# Patient Record
Sex: Female | Born: 1980 | Race: White | Hispanic: No | Marital: Married | Smoking: Never smoker
Health system: Southern US, Community
[De-identification: ages and names within clinical notes are randomized; demographics above are authoritative.]

## PROBLEM LIST (undated history)

## (undated) DIAGNOSIS — F419 Anxiety disorder, unspecified: Secondary | ICD-10-CM

## (undated) DIAGNOSIS — R569 Unspecified convulsions: Secondary | ICD-10-CM

## (undated) DIAGNOSIS — R51 Headache: Secondary | ICD-10-CM

## (undated) DIAGNOSIS — F32A Depression, unspecified: Secondary | ICD-10-CM

## (undated) DIAGNOSIS — R519 Headache, unspecified: Secondary | ICD-10-CM

## (undated) DIAGNOSIS — F329 Major depressive disorder, single episode, unspecified: Secondary | ICD-10-CM

## (undated) DIAGNOSIS — J189 Pneumonia, unspecified organism: Secondary | ICD-10-CM

## (undated) DIAGNOSIS — L309 Dermatitis, unspecified: Secondary | ICD-10-CM

## (undated) DIAGNOSIS — J45909 Unspecified asthma, uncomplicated: Secondary | ICD-10-CM

## (undated) HISTORY — DX: Dermatitis, unspecified: L30.9

## (undated) HISTORY — PX: CHOLECYSTECTOMY: SHX55

## (undated) HISTORY — PX: KNEE ARTHROSCOPY: SUR90

## (undated) HISTORY — PX: DILATION AND CURETTAGE OF UTERUS: SHX78

## (undated) HISTORY — DX: Unspecified asthma, uncomplicated: J45.909

## (undated) HISTORY — PX: UPPER GI ENDOSCOPY: SHX6162

## (undated) HISTORY — PX: WISDOM TOOTH EXTRACTION: SHX21

---

## 2003-07-10 DIAGNOSIS — R569 Unspecified convulsions: Secondary | ICD-10-CM

## 2003-07-10 HISTORY — DX: Unspecified convulsions: R56.9

## 2008-08-28 ENCOUNTER — Inpatient Hospital Stay (HOSPITAL_COMMUNITY): Admission: AD | Admit: 2008-08-28 | Discharge: 2008-08-30 | Payer: Self-pay | Admitting: Certified Nurse Midwife

## 2008-08-29 ENCOUNTER — Encounter (INDEPENDENT_AMBULATORY_CARE_PROVIDER_SITE_OTHER): Payer: Self-pay | Admitting: Obstetrics and Gynecology

## 2010-10-24 LAB — CBC
HCT: 30.8 % — ABNORMAL LOW (ref 36.0–46.0)
HCT: 37.9 % (ref 36.0–46.0)
Hemoglobin: 13.1 g/dL (ref 12.0–15.0)
Hemoglobin: 13.5 g/dL (ref 12.0–15.0)
MCHC: 34.2 g/dL (ref 30.0–36.0)
MCV: 96.8 fL (ref 78.0–100.0)
Platelets: 108 10*3/uL — ABNORMAL LOW (ref 150–400)
RBC: 3.19 MIL/uL — ABNORMAL LOW (ref 3.87–5.11)
RBC: 4.11 MIL/uL (ref 3.87–5.11)
RDW: 13 % (ref 11.5–15.5)
WBC: 12.3 10*3/uL — ABNORMAL HIGH (ref 4.0–10.5)
WBC: 16.8 10*3/uL — ABNORMAL HIGH (ref 4.0–10.5)
WBC: 6.6 10*3/uL (ref 4.0–10.5)

## 2015-03-10 DIAGNOSIS — J189 Pneumonia, unspecified organism: Secondary | ICD-10-CM

## 2015-03-10 HISTORY — DX: Pneumonia, unspecified organism: J18.9

## 2015-05-05 ENCOUNTER — Other Ambulatory Visit: Payer: Self-pay | Admitting: Obstetrics and Gynecology

## 2015-05-25 ENCOUNTER — Encounter (HOSPITAL_COMMUNITY): Payer: Self-pay

## 2015-05-30 ENCOUNTER — Encounter (HOSPITAL_COMMUNITY): Payer: Self-pay | Admitting: *Deleted

## 2015-05-30 ENCOUNTER — Ambulatory Visit (HOSPITAL_COMMUNITY)
Admission: RE | Admit: 2015-05-30 | Discharge: 2015-05-30 | Disposition: A | Discharge status: Home / Self Care | Payer: Commercial Managed Care - PPO | Source: Ambulatory Visit | Attending: Obstetrics and Gynecology | Admitting: Obstetrics and Gynecology

## 2015-05-30 ENCOUNTER — Ambulatory Visit (HOSPITAL_COMMUNITY): Payer: Commercial Managed Care - PPO

## 2015-05-30 ENCOUNTER — Encounter (HOSPITAL_COMMUNITY)
Admission: RE | Disposition: A | Discharge status: Home / Self Care | Payer: Self-pay | Source: Ambulatory Visit | Attending: Obstetrics and Gynecology

## 2015-05-30 DIAGNOSIS — Z302 Encounter for sterilization: Secondary | ICD-10-CM | POA: Insufficient documentation

## 2015-05-30 HISTORY — DX: Pneumonia, unspecified organism: J18.9

## 2015-05-30 HISTORY — DX: Major depressive disorder, single episode, unspecified: F32.9

## 2015-05-30 HISTORY — PX: LAPAROSCOPIC TUBAL LIGATION: SHX1937

## 2015-05-30 HISTORY — DX: Anxiety disorder, unspecified: F41.9

## 2015-05-30 HISTORY — DX: Depression, unspecified: F32.A

## 2015-05-30 HISTORY — DX: Unspecified convulsions: R56.9

## 2015-05-30 HISTORY — DX: Headache: R51

## 2015-05-30 HISTORY — DX: Headache, unspecified: R51.9

## 2015-05-30 LAB — CBC
HCT: 42.5 % (ref 36.0–46.0)
HEMOGLOBIN: 14.4 g/dL (ref 12.0–15.0)
MCH: 31.4 pg (ref 26.0–34.0)
MCHC: 33.9 g/dL (ref 30.0–36.0)
MCV: 92.6 fL (ref 78.0–100.0)
PLATELETS: 182 10*3/uL (ref 150–400)
RBC: 4.59 MIL/uL (ref 3.87–5.11)
RDW: 13.1 % (ref 11.5–15.5)
WBC: 8.6 10*3/uL (ref 4.0–10.5)

## 2015-05-30 LAB — PREGNANCY, URINE: PREG TEST UR: NEGATIVE

## 2015-05-30 SURGERY — LIGATION, FALLOPIAN TUBE, LAPAROSCOPIC
Anesthesia: General | Site: Abdomen | Laterality: Bilateral

## 2015-05-30 MED ORDER — LIDOCAINE HCL (CARDIAC) 20 MG/ML IV SOLN
INTRAVENOUS | Status: DC | PRN
  Administered 2015-05-30: 80 mg via INTRAVENOUS

## 2015-05-30 MED ORDER — ONDANSETRON HCL 4 MG/2ML IJ SOLN
INTRAMUSCULAR | Status: AC
  Filled 2015-05-30: qty 2

## 2015-05-30 MED ORDER — BUPIVACAINE HCL (PF) 0.25 % IJ SOLN
INTRAMUSCULAR | Status: AC
  Filled 2015-05-30: qty 30

## 2015-05-30 MED ORDER — DEXAMETHASONE SODIUM PHOSPHATE 4 MG/ML IJ SOLN
INTRAMUSCULAR | Status: AC
  Filled 2015-05-30: qty 1

## 2015-05-30 MED ORDER — ROCURONIUM BROMIDE 100 MG/10ML IV SOLN
INTRAVENOUS | Status: DC | PRN
  Administered 2015-05-30: 30 mg via INTRAVENOUS

## 2015-05-30 MED ORDER — PROPOFOL 10 MG/ML IV BOLUS
INTRAVENOUS | Status: DC | PRN
  Administered 2015-05-30: 200 mg via INTRAVENOUS

## 2015-05-30 MED ORDER — SODIUM CHLORIDE 0.9 % IJ SOLN
INTRAMUSCULAR | Status: AC
  Filled 2015-05-30: qty 10

## 2015-05-30 MED ORDER — FENTANYL CITRATE (PF) 100 MCG/2ML IJ SOLN: 25.0000 ug | INTRAMUSCULAR | Status: DC | PRN

## 2015-05-30 MED ORDER — SCOPOLAMINE 1 MG/3DAYS TD PT72
1.0000 | MEDICATED_PATCH | Freq: Once | TRANSDERMAL | Status: DC
  Administered 2015-05-30: 1.5 mg via TRANSDERMAL

## 2015-05-30 MED ORDER — KETOROLAC TROMETHAMINE 30 MG/ML IJ SOLN
INTRAMUSCULAR | Status: AC
  Filled 2015-05-30: qty 1

## 2015-05-30 MED ORDER — OXYCODONE-ACETAMINOPHEN 5-325 MG PO TABS
ORAL_TABLET | ORAL | Status: AC
  Filled 2015-05-30: qty 1

## 2015-05-30 MED ORDER — MIDAZOLAM HCL 2 MG/2ML IJ SOLN
INTRAMUSCULAR | Status: DC | PRN
  Administered 2015-05-30: 2 mg via INTRAVENOUS

## 2015-05-30 MED ORDER — SCOPOLAMINE 1 MG/3DAYS TD PT72
MEDICATED_PATCH | TRANSDERMAL | Status: AC
  Administered 2015-05-30: 1.5 mg via TRANSDERMAL
  Filled 2015-05-30: qty 1

## 2015-05-30 MED ORDER — PROPOFOL 10 MG/ML IV BOLUS
INTRAVENOUS | Status: AC
  Filled 2015-05-30: qty 20

## 2015-05-30 MED ORDER — OXYCODONE-ACETAMINOPHEN 5-325 MG PO TABS
1.0000 | ORAL_TABLET | Freq: Once | ORAL | Status: AC
  Administered 2015-05-30: 1 via ORAL

## 2015-05-30 MED ORDER — MIDAZOLAM HCL 2 MG/2ML IJ SOLN
INTRAMUSCULAR | Status: AC
  Filled 2015-05-30: qty 2

## 2015-05-30 MED ORDER — MEPERIDINE HCL 25 MG/ML IJ SOLN: 6.2500 mg | INTRAMUSCULAR | Status: DC | PRN

## 2015-05-30 MED ORDER — GLYCOPYRROLATE 0.2 MG/ML IJ SOLN
INTRAMUSCULAR | Status: DC | PRN
  Administered 2015-05-30: 0.2 mg via INTRAVENOUS
  Administered 2015-05-30: 0.4 mg via INTRAVENOUS

## 2015-05-30 MED ORDER — LIDOCAINE HCL (CARDIAC) 20 MG/ML IV SOLN
INTRAVENOUS | Status: AC
  Filled 2015-05-30: qty 5

## 2015-05-30 MED ORDER — ONDANSETRON HCL 4 MG/2ML IJ SOLN
INTRAMUSCULAR | Status: DC | PRN
  Administered 2015-05-30: 4 mg via INTRAVENOUS

## 2015-05-30 MED ORDER — BUPIVACAINE HCL (PF) 0.25 % IJ SOLN
INTRAMUSCULAR | Status: DC | PRN
  Administered 2015-05-30: 2 mL
  Administered 2015-05-30: 6 mL

## 2015-05-30 MED ORDER — KETOROLAC TROMETHAMINE 30 MG/ML IJ SOLN
INTRAMUSCULAR | Status: DC | PRN
  Administered 2015-05-30: 30 mg via INTRAVENOUS
  Administered 2015-05-30: 30 mg via INTRAMUSCULAR

## 2015-05-30 MED ORDER — FENTANYL CITRATE (PF) 100 MCG/2ML IJ SOLN
INTRAMUSCULAR | Status: DC | PRN
  Administered 2015-05-30 (×3): 50 ug via INTRAVENOUS
  Administered 2015-05-30: 100 ug via INTRAVENOUS

## 2015-05-30 MED ORDER — FENTANYL CITRATE (PF) 100 MCG/2ML IJ SOLN
INTRAMUSCULAR | Status: AC
  Filled 2015-05-30: qty 2

## 2015-05-30 MED ORDER — NEOSTIGMINE METHYLSULFATE 10 MG/10ML IV SOLN
INTRAVENOUS | Status: AC
  Filled 2015-05-30: qty 1

## 2015-05-30 MED ORDER — NEOSTIGMINE METHYLSULFATE 10 MG/10ML IV SOLN
INTRAVENOUS | Status: DC | PRN
  Administered 2015-05-30: 3 mg via INTRAVENOUS

## 2015-05-30 MED ORDER — FENTANYL CITRATE (PF) 250 MCG/5ML IJ SOLN
INTRAMUSCULAR | Status: AC
  Filled 2015-05-30: qty 5

## 2015-05-30 MED ORDER — KETOROLAC TROMETHAMINE 30 MG/ML IJ SOLN: 30.0000 mg | Freq: Once | INTRAMUSCULAR | Status: DC | PRN

## 2015-05-30 MED ORDER — ONDANSETRON HCL 4 MG/2ML IJ SOLN
4.0000 mg | Freq: Once | INTRAMUSCULAR | Status: AC | PRN
  Administered 2015-05-30: 4 mg via INTRAVENOUS

## 2015-05-30 MED ORDER — GLYCOPYRROLATE 0.2 MG/ML IJ SOLN
INTRAMUSCULAR | Status: AC
  Filled 2015-05-30: qty 1

## 2015-05-30 MED ORDER — DEXAMETHASONE SODIUM PHOSPHATE 10 MG/ML IJ SOLN
INTRAMUSCULAR | Status: DC | PRN
  Administered 2015-05-30: 4 mg via INTRAVENOUS

## 2015-05-30 MED ORDER — IBUPROFEN 800 MG PO TABS: 800.0000 mg | ORAL_TABLET | Freq: Three times a day (TID) | ORAL | Status: DC | PRN

## 2015-05-30 MED ORDER — GLYCOPYRROLATE 0.2 MG/ML IJ SOLN
INTRAMUSCULAR | Status: AC
  Filled 2015-05-30: qty 2

## 2015-05-30 MED ORDER — SODIUM CHLORIDE 0.9 % IJ SOLN
INTRAMUSCULAR | Status: DC | PRN
  Administered 2015-05-30: 10 mL

## 2015-05-30 MED ORDER — LACTATED RINGERS IV SOLN
INTRAVENOUS | Status: DC
  Administered 2015-05-30 (×2): via INTRAVENOUS

## 2015-05-30 SURGICAL SUPPLY — 21 items
CATH ROBINSON RED A/P 16FR (CATHETERS) ×3 IMPLANT
CLOTH BEACON ORANGE TIMEOUT ST (SAFETY) ×3 IMPLANT
DRSG COVADERM PLUS 2X2 (GAUZE/BANDAGES/DRESSINGS) IMPLANT
DRSG OPSITE POSTOP 3X4 (GAUZE/BANDAGES/DRESSINGS) ×3 IMPLANT
DURAPREP 26ML APPLICATOR (WOUND CARE) ×3 IMPLANT
ELECT REM PT RETURN 9FT ADLT (ELECTROSURGICAL) ×3
ELECTRODE REM PT RTRN 9FT ADLT (ELECTROSURGICAL) ×1 IMPLANT
GLOVE BIOGEL PI IND STRL 7.0 (GLOVE) ×2 IMPLANT
GLOVE BIOGEL PI INDICATOR 7.0 (GLOVE) ×4
GLOVE ECLIPSE 6.5 STRL STRAW (GLOVE) ×3 IMPLANT
GOWN STRL REUS W/TWL LRG LVL3 (GOWN DISPOSABLE) ×6 IMPLANT
NEEDLE INSUFFLATION 120MM (ENDOMECHANICALS) ×3 IMPLANT
PACK LAPAROSCOPY BASIN (CUSTOM PROCEDURE TRAY) ×3 IMPLANT
PAD POSITIONING PINK XL (MISCELLANEOUS) ×3 IMPLANT
PENCIL BUTTON HOLSTER BLD 10FT (ELECTRODE) ×3 IMPLANT
SUT VICRYL 0 UR6 27IN ABS (SUTURE) ×3 IMPLANT
SUT VICRYL 4-0 PS2 18IN ABS (SUTURE) ×3 IMPLANT
TOWEL OR 17X24 6PK STRL BLUE (TOWEL DISPOSABLE) ×6 IMPLANT
TROCAR OPTI TIP 5M 100M (ENDOMECHANICALS) ×3 IMPLANT
TROCAR XCEL DIL TIP R 11M (ENDOMECHANICALS) ×3 IMPLANT
WATER STERILE IRR 1000ML POUR (IV SOLUTION) ×3 IMPLANT

## 2015-05-30 NOTE — Anesthesia Postprocedure Evaluation (Signed)
Anesthesia Post Note  Patient: Amy [name]  Procedure(s) Performed: Procedure(s) (LRB): LAPAROSCOPIC TUBAL LIGATION With Bipolar Cautery (Bilateral)  Patient location during evaluation: PACU Anesthesia Type: General Level of consciousness: awake Pain management: pain level controlled Vital Signs Assessment: post-procedure vital signs reviewed and stable Respiratory status: spontaneous breathing Cardiovascular status: blood pressure returned to baseline Postop Assessment: No signs of nausea or vomiting Anesthetic complications: no    Last Vitals:  Filed Vitals:   05/30/15 1315 05/30/15 1400  BP: 117/62 125/81  Pulse: 121 106  Temp:  37.1 C  Resp: 18 16    Last Pain:  Filed Vitals:   05/30/15 1410  PainSc: 5                  Attikus Bartoszek JR,JOHN Lauryl Seyer

## 2015-05-30 NOTE — Brief Op Note (Signed)
05/30/2015  11:41 AM  PATIENT:  Amy [name]  34 y.o. female  PRE-OPERATIVE DIAGNOSIS:  Desires Sterilization  POST-OPERATIVE DIAGNOSIS:  Desires Sterilization  PROCEDURE:  Procedure(s): LAPAROSCOPIC TUBAL LIGATION With Bipolar Cautery (Bilateral)  SURGEON:  Surgeon(s) and Role:    * Maxie BetterSheronette Rykin Route, MD - Primary  PHYSICIAN ASSISTANT:   ASSISTANTS: none   ANESTHESIA:   general  EBL:  Total I/O In: 1000 [I.V.:1000] Out: 40 [Urine:30; Blood:10]  BLOOD ADMINISTERED:none  DRAINS: none   LOCAL MEDICATIONS USED:  MARCAINE     SPECIMEN:  No Specimen  DISPOSITION OF SPECIMEN:  N/A  COUNTS:  YES  TOURNIQUET:  * No tourniquets in log *  DICTATION: .Other Dictation: Dictation Number [id number]  PLAN OF CARE: Discharge to home after PACU  PATIENT DISPOSITION:  PACU - hemodynamically stable.   Delay start of Pharmacological VTE agent (>24hrs) due to surgical blood loss or risk of bleeding: no

## 2015-05-30 NOTE — H&P (Signed)
Amy ClicheVonda W [name] is an 34 y.o. female. G1P1 MF presents for permanent sterilization via LTL. Pt is on OCP. Hx migraine. Declines alternative birth control( LARC)  Pertinent Gynecological History: Menses: flow is light Bleeding: reg Contraception: OCP (estrogen/progesterone) DES exposure: denies Blood transfusions: none Sexually transmitted diseases: no past history Previous GYN Procedures: none  Last mammogram: n/a Date: n/a Last pap: normal Date:7.23.14 OB History: G1, P1   Menstrual History: Menarche age: n/a No LMP recorded. Patient is not currently having periods (Reason: Oral contraceptives).    Past Medical History  Diagnosis Date  . Pneumonia 03/2015  . Depression   . Anxiety   . Seizures (HCC) 2005    no medications currently  . Headache     Migraines    Past Surgical History  Procedure Laterality Date  . Cholecystectomy    . Knee arthroscopy    . Wisdom tooth extraction    . Upper gi endoscopy    . Dilation and curettage of uterus      History reviewed. No pertinent family history.  Social History:  reports that she has never smoked. She has never used smokeless tobacco. She reports that she drinks alcohol. She reports that she does not use illicit drugs.  Allergies:  Allergies  Allergen Reactions  . Codeine Nausea And Vomiting    Patients states she is able to take it with antinausea medications  . Tramadol Other (See Comments)    Headaches  . Clindamycin/Lincomycin Rash   Medication: Lexapro 10mg  po qd Wellbutrin XL 300mg  q d Alesse one po qd Maxalt 10 mg Ambien 5 mg po qhs prn  Review of Systems  All other systems reviewed and are negative.   There were no vitals taken for this visit. Physical Exam  Constitutional: She is oriented to person, place, and time. She appears well-developed and well-nourished.  Eyes: EOM are normal.  Cardiovascular: Normal rate and regular rhythm.   Respiratory: Breath sounds normal.  GI: Soft.  Genitourinary:  Uterus normal.  Neurological: She is oriented to person, place, and time.  Skin: Skin is warm and dry.  Psychiatric: She has a normal mood and affect.  vulva nl  Vagina nl Cervix nl Uterus nl anterior Adnexa nl  No results found for this or any previous visit (from the past 24 hour(s)).  No results found.  Assessment/Plan: Desires sterilization P) LTL with cautery. Risk of surgery includes infection, bleeding, injury to Underlying organ structures, permanent sterilization, failure rate 1/500-1/600, nonreversible. All ? answered  Rodolfo Notaro A 05/30/2015, 9:43 AM

## 2015-05-30 NOTE — Anesthesia Postprocedure Evaluation (Signed)
Anesthesia Post Note  Patient: Amy ClicheVonda W [name]  Procedure(s) Performed: Procedure(s) (LRB): LAPAROSCOPIC TUBAL LIGATION With Bipolar Cautery (Bilateral)  Patient location during evaluation: PACU Anesthesia Type: General Level of consciousness: awake and alert Pain management: pain level controlled Vital Signs Assessment: post-procedure vital signs reviewed and stable Respiratory status: spontaneous breathing, nonlabored ventilation, respiratory function stable and patient connected to nasal cannula oxygen Cardiovascular status: blood pressure returned to baseline and stable Postop Assessment: No signs of nausea or vomiting Anesthetic complications: no    Last Vitals:  Filed Vitals:   05/30/15 0958 05/30/15 1149  BP: 132/87 127/76  Pulse: 89 111  Temp: 36.8 C 37.1 C  Resp: 18 18    Last Pain: There were no vitals filed for this visit.               [name]

## 2015-05-30 NOTE — Transfer of Care (Signed)
Immediate Anesthesia Transfer of Care Note  Patient: Amy [name]  Procedure(s) Performed: Procedure(s): LAPAROSCOPIC TUBAL LIGATION With Bipolar Cautery (Bilateral)  Patient Location: PACU  Anesthesia Type:General  Level of Consciousness: awake, alert  and oriented  Airway & Oxygen Therapy: Patient Spontanous Breathing and Patient connected to nasal cannula oxygen  Post-op Assessment: Report given to RN and Post -op Vital signs reviewed and stable  Post vital signs: Reviewed and stable  Last Vitals:  Filed Vitals:   05/30/15 0958  BP: 132/87  Pulse: 89  Temp: 36.8 C  Resp: 18    Complications: No apparent anesthesia complications

## 2015-05-30 NOTE — Discharge Instructions (Signed)
Warm heat to abdomen every 4 hrs x 24 hrs. Ambulate. Finish pill packDISCHARGE INSTRUCTIONS: Laparoscopy  The following instructions have been prepared to help you care for yourself upon your return home today.  Wound care:  Do not get the incision wet for the first 24 hours. The incision should be kept clean and dry.  The Band-Aids or dressings may be removed the day after surgery.  Should the incision become sore, red, and swollen after the first week, check with your doctor.  Personal hygiene:  Shower the day after your procedure.  Activity and limitations:  Do NOT drive or operate any equipment today.  Do NOT lift anything more than 15 pounds for 2-3 weeks after surgery.  Do NOT rest in bed all day.  Walking is encouraged. Walk each day, starting slowly with 5-minute walks 3 or 4 times a day. Slowly increase the length of your walks.  Walk up and down stairs slowly.  Do NOT do strenuous activities, such as golfing, playing tennis, bowling, running, biking, weight lifting, gardening, mowing, or vacuuming for 2-4 weeks. Ask your doctor when it is okay to start.  Diet: Eat a light meal as desired this evening. You may resume your usual diet tomorrow.  Return to work: This is dependent on the type of work you do. For the most part you can return to a desk job within a week of surgery. If you are more active at work, please discuss this with your doctor.  What to expect after your surgery: You may have a slight burning sensation when you urinate on the first day. You may have a very small amount of blood in the urine. Expect to have a small amount of vaginal discharge/light bleeding for 1-2 weeks. It is not unusual to have abdominal soreness and bruising for up to 2 weeks. You may be tired and need more rest for about 1 week. You may experience shoulder pain for 24-72 hours. Lying flat in bed may relieve it.  Call your doctor for any of the following:  Develop a fever of 100.4 or  greater  Inability to urinate 6 hours after discharge from hospital  Severe pain not relieved by pain medications  Persistent of heavy bleeding at incision site  Redness or swelling around incision site after a week  Increasing nausea or vomiting  Patient Signature________________________________________ Nurse Signature_________________________________________ Post Anesthesia Home Care Instructions  Activity: Get plenty of rest for the remainder of the day. A responsible adult should stay with you for 24 hours following the procedure.  For the next 24 hours, DO NOT: -Drive a car -Advertising copywriterperate machinery -Drink alcoholic beverages -Take any medication unless instructed by your physician -Make any legal decisions or sign important papers.  Meals: Start with liquid foods such as gelatin or soup. Progress to regular foods as tolerated. Avoid greasy, spicy, heavy foods. If nausea and/or vomiting occur, drink only clear liquids until the nausea and/or vomiting subsides. Call your physician if vomiting continues.  Special Instructions/Symptoms: Your throat may feel dry or sore from the anesthesia or the breathing tube placed in your throat during surgery. If this causes discomfort, gargle with warm salt water. The discomfort should disappear within 24 hours.  If you had a scopolamine patch placed behind your ear for the management of post- operative nausea and/or vomiting:  1. The medication in the patch is effective for 72 hours, after which it should be removed.  Wrap patch in a tissue and discard in the trash.  Wash hands thoroughly with soap and water. °2. You may remove the patch earlier than 72 hours if you experience unpleasant side effects which may include dry mouth, dizziness or visual disturbances. °3. Avoid touching the patch. Wash your hands with soap and water after contact with the patch. °  ° °

## 2015-05-30 NOTE — Anesthesia Preprocedure Evaluation (Signed)
Anesthesia Evaluation  Patient identified by MRN, date of birth, ID band Patient awake    Reviewed: Allergy & Precautions, H&P , NPO status , Patient's Chart, lab work & pertinent test results  Airway Mallampati: I  TM Distance: >3 FB Neck ROM: full    Dental no notable dental hx.    Pulmonary    Pulmonary exam normal        Cardiovascular negative cardio ROS Normal cardiovascular exam     Neuro/Psych    GI/Hepatic negative GI ROS, Neg liver ROS,   Endo/Other  negative endocrine ROS  Renal/GU negative Renal ROS     Musculoskeletal   Abdominal Normal abdominal exam  (+)   Peds  Hematology negative hematology ROS (+)   Anesthesia Other Findings   Reproductive/Obstetrics (+) Pregnancy                             Anesthesia Physical Anesthesia Plan  ASA: II  Anesthesia Plan: General   Post-op Pain Management:    Induction: Intravenous  Airway Management Planned: Oral ETT  Additional Equipment:   Intra-op Plan:   Post-operative Plan: Extubation in OR  Informed Consent: I have reviewed the patients History and Physical, chart, labs and discussed the procedure including the risks, benefits and alternatives for the proposed anesthesia with the patient or authorized representative who has indicated his/her understanding and acceptance.     Plan Discussed with: CRNA and Surgeon  Anesthesia Plan Comments:         Anesthesia Quick Evaluation

## 2015-05-30 NOTE — Anesthesia Procedure Notes (Signed)
Procedure Name: Intubation Date/Time: 05/30/2015 11:02 AM Performed by: COUSINS, SHERONETTE Pre-anesthesia Checklist: Patient identified, Emergency Drugs available, Suction available and Patient being monitored Patient Re-evaluated:Patient Re-evaluated prior to inductionOxygen Delivery Method: Circle system utilized Preoxygenation: Pre-oxygenation with 100% oxygen Intubation Type: IV induction Ventilation: Mask ventilation without difficulty Laryngoscope Size: Miller and 2 Grade View: Grade II Tube type: Oral Tube size: 7.0 mm Number of attempts: 2 Airway Equipment and Method: Stylet Placement Confirmation: ETT inserted through vocal cords under direct vision,  breath sounds checked- equal and bilateral and positive ETCO2 Secured at: 21 cm Tube secured with: Tape Dental Injury: Teeth and Oropharynx as per pre-operative assessment

## 2015-05-31 ENCOUNTER — Encounter (HOSPITAL_COMMUNITY): Payer: Self-pay | Admitting: Obstetrics and Gynecology

## 2015-05-31 NOTE — Op Note (Signed)
[name]:  [name], Amy              ACCOUNT NO.:  [id number]  MEDICAL RECORD NO.:  [id number]  LOCATION:  [facility]                          FACILITY:  [facility]  PHYSICIAN:  Maxie BetterSheronette Lydie Stammen, M.D.DATE OF BIRTH:  1981-04-10  DATE OF PROCEDURE:  05/30/2015 DATE OF DISCHARGE:  05/30/2015                              OPERATIVE REPORT   PREOPERATIVE DIAGNOSIS:  Desires sterilization.  PROCEDURE:  Laparoscopic tubal ligation with bipolar cautery.  POSTOPERATIVE DIAGNOSIS:  Desires sterilization.  ANESTHESIA:  General.  SURGEON:  Maxie BetterSheronette Argenis Kumari, M.D.  ASSISTANT:  None.  DESCRIPTION OF PROCEDURE:  Under adequate general anesthesia, the patient was placed in dorsal lithotomy position.  She was sterilely prepped in the  usual fashion.  Bladder was catheterized for moderate amount of urine.  Examination under anesthesia revealed an axial uterus.  No adnexal masses could be appreciated.  Bivalve speculum placed in the vagina.  Single-tooth tenaculum was placed on the anterior lip of the cervix.  An Acorn cannula was introduced into the cervical os and attached to the tenaculum for manipulation of the uterus.  The bivalve speculum was then removed.  The patient was sterilely draped.  A 0.25% Marcaine was injected.  An infraumbilical incision was then made. Veress needle was introduced, tested, and 3 L of CO2 was insufflated. Veress needle was then removed.  A 10 mm disposable trocar with sleeve was introduced into the abdomen without incident.  A lighted video laparoscope was then placed through that port.  Entry into the abdomen without incident.  Panoramic inspection revealed a normal liver edge. Small amount of adhesion on the right anterior abdominal wall and the pelvis was notable for both ovaries to be normal.  No evidence of endometriosis.  Normal fallopian tubes bilaterally.  A suprapubic incision was made after 0.25% Marcaine was injected and a 5-mm port was placed under direct  visualization.  Further inspection was continued through the lower port.  Bipolar cautery was then placed.  The midportion of both fallopian tubes was then cauterized.  Once this was felt to be adequate, the suprapubic site was removed under direct visualization.  The abdomen was deflated as the infraumbilical site was then removed.  0-Vicryl figure-of-eight sutures placed through the fascia in the infraumbilical site and the skin incisions were then closed with 4-0 Vicryl subcuticular sutures.  The instruments in the vagina were removed.  SPECIMEN:  None.  ESTIMATED BLOOD LOSS:  Minimal.  COMPLICATIONS:  None.  The patient tolerated the procedure well, was transferred to the recovery room in stable condition.     Maxie BetterSheronette Eura Radabaugh, M.D.     [place]/MEDQ  D:  05/31/2015  T:  05/31/2015  Job:  [id number]

## 2021-07-09 HISTORY — PX: OTHER SURGICAL HISTORY: SHX169

## 2021-08-09 ENCOUNTER — Other Ambulatory Visit: Payer: Self-pay

## 2021-08-09 ENCOUNTER — Emergency Department (HOSPITAL_BASED_OUTPATIENT_CLINIC_OR_DEPARTMENT_OTHER): Payer: Commercial Managed Care - PPO

## 2021-08-09 ENCOUNTER — Emergency Department (HOSPITAL_BASED_OUTPATIENT_CLINIC_OR_DEPARTMENT_OTHER)
Admission: EM | Admit: 2021-08-09 | Discharge: 2021-08-09 | Disposition: A | Discharge status: Home / Self Care | Payer: Commercial Managed Care - PPO | Attending: Emergency Medicine | Admitting: Emergency Medicine

## 2021-08-09 ENCOUNTER — Encounter (HOSPITAL_BASED_OUTPATIENT_CLINIC_OR_DEPARTMENT_OTHER): Payer: Self-pay | Admitting: Emergency Medicine

## 2021-08-09 DIAGNOSIS — M545 Low back pain, unspecified: Secondary | ICD-10-CM | POA: Insufficient documentation

## 2021-08-09 DIAGNOSIS — R310 Gross hematuria: Secondary | ICD-10-CM | POA: Insufficient documentation

## 2021-08-09 DIAGNOSIS — R1030 Lower abdominal pain, unspecified: Secondary | ICD-10-CM | POA: Insufficient documentation

## 2021-08-09 DIAGNOSIS — R319 Hematuria, unspecified: Secondary | ICD-10-CM | POA: Diagnosis present

## 2021-08-09 LAB — CBC WITH DIFFERENTIAL/PLATELET
Abs Immature Granulocytes: 0.01 10*3/uL (ref 0.00–0.07)
Basophils Absolute: 0.1 10*3/uL (ref 0.0–0.1)
Basophils Relative: 1 %
Eosinophils Absolute: 0.1 10*3/uL (ref 0.0–0.5)
Eosinophils Relative: 2 %
HCT: 40.6 % (ref 36.0–46.0)
Hemoglobin: 14.4 g/dL (ref 12.0–15.0)
Immature Granulocytes: 0 %
Lymphocytes Relative: 23 %
Lymphs Abs: 1.3 10*3/uL (ref 0.7–4.0)
MCH: 30.9 pg (ref 26.0–34.0)
MCHC: 35.5 g/dL (ref 30.0–36.0)
MCV: 87.1 fL (ref 80.0–100.0)
Monocytes Absolute: 0.4 10*3/uL (ref 0.1–1.0)
Monocytes Relative: 8 %
Neutro Abs: 3.5 10*3/uL (ref 1.7–7.7)
Neutrophils Relative %: 66 %
Platelets: 178 10*3/uL (ref 150–400)
RBC: 4.66 MIL/uL (ref 3.87–5.11)
RDW: 12.2 % (ref 11.5–15.5)
WBC: 5.4 10*3/uL (ref 4.0–10.5)
nRBC: 0 % (ref 0.0–0.2)

## 2021-08-09 LAB — PROTIME-INR
INR: 1 (ref 0.8–1.2)
Prothrombin Time: 12.8 seconds (ref 11.4–15.2)

## 2021-08-09 LAB — URINALYSIS, MICROSCOPIC (REFLEX): RBC / HPF: >50 RBC/hpf (ref 0–5)

## 2021-08-09 LAB — URINALYSIS, ROUTINE W REFLEX MICROSCOPIC
Bilirubin Urine: NEGATIVE
Glucose, UA: NEGATIVE mg/dL
Ketones, ur: NEGATIVE mg/dL
Leukocytes,Ua: NEGATIVE
Nitrite: NEGATIVE
Protein, ur: NEGATIVE mg/dL
Specific Gravity, Urine: 1.02 (ref 1.005–1.030)
pH: 6 (ref 5.0–8.0)

## 2021-08-09 LAB — COMPREHENSIVE METABOLIC PANEL
ALT: 25 U/L (ref 0–44)
AST: 35 U/L (ref 15–41)
Albumin: 4.5 g/dL (ref 3.5–5.0)
Alkaline Phosphatase: 50 U/L (ref 38–126)
Anion gap: 9 (ref 5–15)
BUN: 14 mg/dL (ref 6–20)
CO2: 25 mmol/L (ref 22–32)
Calcium: 8.8 mg/dL — ABNORMAL LOW (ref 8.9–10.3)
Chloride: 104 mmol/L (ref 98–111)
Creatinine, Ser: 0.86 mg/dL (ref 0.44–1.00)
GFR, Estimated: >60 mL/min (ref >60)
Glucose, Bld: 109 mg/dL — ABNORMAL HIGH (ref 70–99)
Potassium: 4.2 mmol/L (ref 3.5–5.1)
Sodium: 138 mmol/L (ref 135–145)
Total Bilirubin: 1 mg/dL (ref 0.3–1.2)
Total Protein: 7.3 g/dL (ref 6.5–8.1)

## 2021-08-09 LAB — PREGNANCY, URINE: Preg Test, Ur: NEGATIVE

## 2021-08-09 MED ORDER — IOHEXOL 300 MG/ML  SOLN
100.0000 mL | Freq: Once | INTRAMUSCULAR | Status: AC | PRN
  Administered 2021-08-09: 100 mL via INTRAVENOUS

## 2021-08-09 NOTE — ED Notes (Signed)
Pt ambulatory with steady gait to restroom to provide urine specimen 
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
## 2022-11-20 NOTE — Progress Notes (Signed)
NEW PATIENT Date of Service/Encounter:  11/22/22 Referring provider: Raynelle Jan., MD Primary care provider: Raynelle Jan., MD  Subjective:  Amy [name] is a 42 y.o. female with a PMHx of hyperlipidemia, insomnia presenting today for evaluation of asthma and allergies. History obtained from: chart review and patient.   Asthma History:  -Diagnosed at age 44 yo.  -Current symptoms include  throat tightness and chest tightness, SOB, trouble getting air in Using rescue inhaler once or twice per month -Limitations to daily activity:  moderate - 0 ED visits, 0 UC visits and 1 oral steroids in the past year - 0 number of lifetime hospitalizations, 0 number of lifetime intubations.  - Identified Triggers:  tree pollen and exercise - Up-to-date with Covid-19, and Flu, vaccines. - History of prior pneumonias: yes-around 4 years ago, had PNA twice then then next year once and this was when she was diagnosed with asthma and started advair, no pneumonias since-treated as outpatient - History of prior COVID-19 infection: 2023, 2022-did okay respiratory - Smoking exposure: no Previous Diagnostics:  - Prior PFTs or spirometry: none - Most Recent AEC (02/03/22): 100 -Most Recent Chest Imaging: CXR on (05/06/20): read as normal, images not available for review Management:  - Current regimen:  - Maintenance: advair 500-50 mcg/dose 1 puff BID, singulair - Rescue: Albuterol 2 puffs q4-6 hrs PRN, none prior to exercise  Chronic rhinitis: started in adulthood Symptoms include:  headache, cough, nasal congestion, and rhinorrhea, dry eyes but otherwise no ocular symptoms Irritants (perfumes, strong smells) give her immediate headache Occurs seasonally-spring and fall Potential triggers: tree pollen Treatments tried: zyrtec and flonsae Previous allergy testing:  around 5 or 42 yo, allergic to dust History of reflux/heartburn: no Previous sinus, ear, tonsil, adenoid surgeries: no  Eczema:   Occasionally around her mouth Will use OTC cetaphil  And if stays moisturized   Other allergy screening: Food allergy: no Medication allergy:  clindamycin causes a rash, codeine makes me nauseous Hymenoptera allergy: no Urticaria: no History of recurrent infections suggestive of immunodeficency: no Vaccinations are up to date.   Past Medical History: Past Medical History:  Diagnosis Date   Anxiety    Asthma    Depression    Eczema    Headache    Migraines   Pneumonia 03/10/2015   Seizures (HCC) 07/10/2003   no medications currently   Vaginal delivery 07/09/2008   Medication List:  Current Outpatient Medications  Medication Sig Dispense Refill   ALPRAZolam (XANAX) 0.5 MG tablet TAKE 1 TABLET BY MOUTH EVERY DAY FOR ANXIETY     bupivacaine (MARCAINE) 0.25 % injection Inject into the skin.     Crisaborole (EUCRISA) 2 % OINT Apply 1 Application topically 2 (two) times daily as needed. 100 g 3   cyclobenzaprine (FLEXERIL) 5 MG tablet Take 5 mg by mouth at bedtime as needed.     escitalopram (LEXAPRO) 10 MG tablet Take 10 mg by mouth daily.     fluticasone (FLONASE) 50 MCG/ACT nasal spray 1 spray Once Daily.     hydrocortisone (ANUSOL-HC) 25 MG suppository INSERT ONE SUPPOSITORY RECTALLY TWICE A DAY FOR 6 DAYS     hydrocortisone 2.5 % ointment Apply topically twice daily as need to red sandpapery rash. 30 g 3   Lactobacillus Rhamnosus, GG, (CULTURELLE) CAPS Take 1 capsule by mouth daily.     levocetirizine (XYZAL) 5 MG tablet Take 1 tablet (5 mg total) by mouth every evening. 32 tablet 5   methocarbamol (ROBAXIN)  500 MG tablet TAKE ONE TABLET BY MOUTH FOUR TIMES A DAY **DO NOT TAKE WITHIN 6 HOURS OF CYCLOBENZAPRINE**     Naltrexone-buPROPion HCl ER (CONTRAVE) 8-90 MG TB12 Take 2 tablets by mouth 2 (two) times daily.     Olopatadine HCl 0.2 % SOLN Apply 2 drops to eye 2 (two) times daily as needed. 2.5 mL 5   Olopatadine-Mometasone (RYALTRIS) 665-25 MCG/ACT SUSP Place 1-2 sprays  into the nose 2 (two) times daily. 29 g 5   ondansetron (ZOFRAN-ODT) 4 MG disintegrating tablet Take by mouth.     rizatriptan (MAXALT) 10 MG tablet Take 10 mg by mouth as needed for migraine. May repeat in 2 hours if needed     traZODone (DESYREL) 50 MG tablet Take 50 mg by mouth at bedtime.     triamcinolone acetonide (KENALOG-40) 40 MG/ML injection Inject into the articular space.     zolpidem (AMBIEN) 5 MG tablet Take 10 mg by mouth at bedtime as needed for sleep.     albuterol (VENTOLIN HFA) 108 (90 Base) MCG/ACT inhaler Inhale 2 puffs into the lungs every 4 (four) hours as needed for wheezing or shortness of breath. 8 g 2   fluticasone-salmeterol (ADVAIR) 500-50 MCG/ACT AEPB Inhale 1 puff into the lungs in the morning and at bedtime. 1 each 5   montelukast (SINGULAIR) 10 MG tablet Take 1 tablet (10 mg total) by mouth daily. 32 tablet 5   No current facility-administered medications for this visit.   Known Allergies:  Allergies  Allergen Reactions   Codeine Nausea And Vomiting    Patients states she is able to take it with antinausea medications   Tramadol Other (See Comments)    Headaches   Clindamycin/Lincomycin Rash   Past Surgical History: Past Surgical History:  Procedure Laterality Date   CHOLECYSTECTOMY     DILATION AND CURETTAGE OF UTERUS     KNEE ARTHROSCOPY     LAPAROSCOPIC TUBAL LIGATION Bilateral 05/30/2015   Procedure: LAPAROSCOPIC TUBAL LIGATION With Bipolar Cautery;  Surgeon: Maxie Better, MD;  Location: [facility];  Service: Gynecology;  Laterality: Bilateral;   lumpectomy  2023   UPPER GI ENDOSCOPY     WISDOM TOOTH EXTRACTION     Family History: History reviewed. No pertinent family history. Social History: Kissy lives in a house built 40 to 50 years ago, basement with water damage, hardwood floors, casein, central AC, indoor cat and dog, using dust mite protection the bedding and pillows, no smoke exposure, works as a Warden/ranger for 13 years.  Not  exposed to fumes chemicals or dust.  HEPA filter is not in the home.  [place]   ROS:  All other systems negative except as noted per HPI.  Objective:  Blood pressure 108/74, pulse 83, temperature 98.3 F (36.8 C), temperature source Temporal, resp. rate 16, height 5\' 6"  (1.676 m), weight 160 lb 1.6 oz (72.6 kg), SpO2 97 %. Body mass index is 25.84 kg/m. Physical Exam:  General Appearance:  Alert, cooperative, no distress, appears stated age  Head:  Normocephalic, without obvious abnormality, atraumatic  Eyes:  Conjunctiva clear, EOM's intact  Nose: Nares normal, hypertrophic turbinates, normal mucosa, and no visible anterior polyps  Throat: Lips, tongue normal; teeth and gums normal, normal posterior oropharynx  Neck: Supple, symmetrical  Lungs:   clear to auscultation bilaterally, Respirations unlabored, no coughing  Heart:  regular rate and rhythm and no murmur, Appears well perfused  Extremities: No edema  Skin: Skin  color, texture, turgor normal and no rashes or lesions on visualized portions of skin  Neurologic: No gross deficits     Diagnostics: Spirometry: Tracings reviewed. Her effort: Good reproducible efforts. FVC: 2.92L (pre), 3.10L  (post) FEV1: 2.27L, 73% predicted (pre), 2.84L, 91% predicted (post) FEV1/FVC ratio: 0.78 (pre), 0.92 (post) Interpretation:  nonobstructive ratio with low FEV1, possible restriction with significant bronchodilator response  Skin Testing: Environmental allergy panel.  Adequate controls. Results discussed with patient/family.  Airborne Adult Perc - 11/22/22 0929     Time Antigen Placed 6578    Allergen Manufacturer Waynette Buttery    Location Back    Number of Test 59    Panel 1 Select    1. Control-Buffer 50% Glycerol Negative    2. Control-Histamine 1 mg/ml 3+    3. Albumin saline Negative    4. Bahia Negative    5. French Southern Territories Negative    6. Johnson Negative    7. Kentucky Blue Negative    8. Meadow  Fescue Negative    9. Perennial Rye Negative    10. Sweet Vernal Negative    11. Timothy Negative    12. Cocklebur Negative    13. Burweed Marshelder Negative    14. Ragweed, short Negative    15. Ragweed, Giant Negative    16. Plantain,  English Negative    17. Lamb's Quarters Negative    18. Sheep Sorrell Negative    19. Rough Pigweed Negative    20. Marsh Elder, Rough Negative    21. Mugwort, Common Negative    22. Ash mix Negative    23. Birch mix Negative    24. Beech American Negative    25. Box, Elder Negative    26. Cedar, red Negative    27. Cottonwood, Guinea-Bissau Negative    28. Elm mix Negative    29. Hickory Negative    30. Maple mix Negative    31. Oak, Guinea-[place]issau mix Negative    32. Pecan Pollen Negative    33. Pine mix Negative    34. Sycamore Eastern Negative    35. Walnut, Black Pollen Negative    36. Alternaria alternata Negative    37. Cladosporium Herbarum Negative    38. Aspergillus mix Negative    39. Penicillium mix Negative    40. Bipolaris sorokiniana (Helminthosporium) 2+    41. Drechslera spicifera (Curvularia) 2+    42. Mucor plumbeus Negative    43. Fusarium moniliforme Negative    44. Aureobasidium pullulans (pullulara) Negative    45. Rhizopus oryzae Negative    46. Botrytis cinera Negative    47. Epicoccum nigrum Negative    48. Phoma betae Negative    49. Candida Albicans Negative    50. Trichophyton mentagrophytes Negative    51. Mite, D Farinae  5,000 AU/ml Negative    52. Mite, D Pteronyssinus  5,000 AU/ml Negative    53. Cat Hair 10,000 BAU/ml 2+    54.  Dog Epithelia Negative    55. Mixed Feathers Negative    56. Horse Epithelia Negative    57. Cockroach, German Negative    58. Mouse Negative    59. Tobacco Leaf Negative             Intradermal - 11/22/22 1056     Time Antigen Placed --             Allergy testing results were read and interpreted by myself, documented by clinical staff.  Assessment and Plan  Chronic Rhinitis: determined to be Perennial Allergic: - allergy testing today: positive to cat, indoor/outdoor molds, negative IDs. - Prevention:  - allergen avoidance when possible - consider allergy shots as long term control of your symptoms by teaching your immune system to be more tolerant of your allergy triggers - Symptom control: - Start Ryaltris 1-2 sprays in each nostril twice a day as needed for nasal congestion/itchy nose - Continue Singulair (Montelukast) 10mg  nightly.   - Discontinue if nightmares of behavior changes. - Continue Antihistamine: daily or daily as needed.   -Options include Zyrtec (Cetirizine) 10mg , Claritin (Loratadine) 10mg , Allegra (Fexofenadine) 180mg , or Xyzal (Levocetirinze) 5mg  - Can be purchased over-the-counter if not covered by insurance.  Allergic Conjunctivitis:  - Consider Allergy Eye drops-great options include Pataday (Olopatadine) or Zaditor (ketotifen) for eye symptoms daily as needed-both sold over the counter if not covered by insurance. and Rewetting Drops such as Systane,TheraTears, etc  -Avoid eye drops that say red eye relief as they may contain medications that dry out your eyes. . Mild persistent Asthma: - your lung testing today showed mild restriction but did improve with albuterol, concerning for asthma - Controller Inhaler: Continue Advair 500 cmg 1 puff twice a day; This Should Be Used Everyday - Rinse mouth out after use - Rescue Inhaler: Albuterol (Proair/Ventolin) 2 puffs . Use  every 4-6 hours as needed for chest tightness, wheezing, or coughing.  Can also use 15 minutes prior to exercise if you have symptoms with activity. - Asthma is not controlled if:  - Symptoms are occurring >2 times a week OR  - >2 times a month nighttime awakenings  - You are requiring systemic steroids (prednisone/steroid injections) more than once per year  - Your require hospitalization for your asthma.  - Please call the clinic to schedule a follow  up if these symptoms arise  Atopic Dermatitis:  Daily Care For Maintenance (daily and continue even once eczema controlled) - Use hypoallergenic hydrating ointment at least twice daily.  This must be done daily for control of flares. (Great options include Vaseline, CeraVe, Aquaphor, Aveeno, Cetaphil, VaniCream, etc) - Avoid detergents, soaps or lotions with fragrances/dyes - Limit showers/baths to 5 minutes and use luke warm water instead of hot, pat dry following baths, and apply moisturizer - can use steroid/non-steroid therapy creams as detailed below up to twice weekly for prevention of flares.  For Flares:(add this to maintenance therapy if needed for flares) First apply steroid/non-steroid treatment creams. Wait 5 minutes then apply moisturizer.  - Hydrocortisone 2.5% to face/body-apply topically twice daily to red, raised areas of skin, followed by moisturizer - Non-steroid treatment options:  Eucrisa 2% apply topically twice daily as needed (can use in place of steroid creams if desires)   Follow up : 3 months, sooner for allergy injections It was a pleasure meeting you in clinic today! Thank you for allowing me to participate in your care.  This note in its entirety was forwarded to the Provider who requested this consultation.  Thank you for your kind referral. I appreciate the opportunity to take part in Aryah's care. Please do not hesitate to contact me with questions.  Sincerely,  Tonny Bollman, MD [facility]

## 2022-11-22 ENCOUNTER — Encounter: Payer: Self-pay | Admitting: Internal Medicine

## 2022-11-22 ENCOUNTER — Ambulatory Visit: Payer: Commercial Managed Care - PPO | Admitting: Internal Medicine

## 2022-11-22 VITALS — BP 108/74 | HR 83 | Temp 98.3°F | Resp 16 | Ht 66.0 in | Wt 160.1 lb

## 2022-11-22 DIAGNOSIS — J4530 Mild persistent asthma, uncomplicated: Secondary | ICD-10-CM | POA: Insufficient documentation

## 2022-11-22 DIAGNOSIS — J3089 Other allergic rhinitis: Secondary | ICD-10-CM | POA: Diagnosis not present

## 2022-11-22 DIAGNOSIS — L308 Other specified dermatitis: Secondary | ICD-10-CM | POA: Diagnosis not present

## 2022-11-22 DIAGNOSIS — L309 Dermatitis, unspecified: Secondary | ICD-10-CM | POA: Insufficient documentation

## 2022-11-22 DIAGNOSIS — H1013 Acute atopic conjunctivitis, bilateral: Secondary | ICD-10-CM | POA: Insufficient documentation

## 2022-11-22 MED ORDER — EUCRISA 2 % EX OINT: 1.0000 | TOPICAL_OINTMENT | Freq: Two times a day (BID) | CUTANEOUS | 3 refills | Status: DC | PRN

## 2022-11-22 MED ORDER — HYDROCORTISONE 2.5 % EX OINT: TOPICAL_OINTMENT | CUTANEOUS | 3 refills | Status: DC

## 2022-11-22 MED ORDER — ALBUTEROL SULFATE HFA 108 (90 BASE) MCG/ACT IN AERS: 2.0000 | INHALATION_SPRAY | RESPIRATORY_TRACT | 2 refills | Status: DC | PRN

## 2022-11-22 MED ORDER — MONTELUKAST SODIUM 10 MG PO TABS: 10.0000 mg | ORAL_TABLET | Freq: Every day | ORAL | 5 refills | Status: DC

## 2022-11-22 MED ORDER — OLOPATADINE HCL 0.2 % OP SOLN: 2.0000 [drp] | Freq: Two times a day (BID) | OPHTHALMIC | 5 refills | Status: DC | PRN

## 2022-11-22 MED ORDER — FLUTICASONE-SALMETEROL 500-50 MCG/ACT IN AEPB: 1.0000 | INHALATION_SPRAY | Freq: Two times a day (BID) | RESPIRATORY_TRACT | 5 refills | Status: DC

## 2022-11-22 MED ORDER — RYALTRIS 665-25 MCG/ACT NA SUSP: 1.0000 | Freq: Two times a day (BID) | NASAL | 5 refills | Status: DC

## 2022-11-22 MED ORDER — LEVOCETIRIZINE DIHYDROCHLORIDE 5 MG PO TABS: 5.0000 mg | ORAL_TABLET | Freq: Every evening | ORAL | 5 refills | Status: DC

## 2022-11-22 NOTE — Patient Instructions (Addendum)
Chronic Rhinitis: determined to be Perennial Allergic: - allergy testing today: positive to cat, indoor/outdoor molds - Prevention:  - allergen avoidance when possible - consider allergy shots as long term control of your symptoms by teaching your immune system to be more tolerant of your allergy triggers - Symptom control: - Start Ryaltris 1-2 sprays in each nostril twice a day as needed for nasal congestion/itchy nose - Continue Singulair (Montelukast) 10mg  nightly.   - Discontinue if nightmares of behavior changes. - Continue Antihistamine: daily or daily as needed.   -Options include Zyrtec (Cetirizine) 10mg , Claritin (Loratadine) 10mg , Allegra (Fexofenadine) 180mg , or Xyzal (Levocetirinze) 5mg  - Can be purchased over-the-counter if not covered by insurance.  Allergic Conjunctivitis:  - Consider Allergy Eye drops-great options include Pataday (Olopatadine) or Zaditor (ketotifen) for eye symptoms daily as needed-both sold over the counter if not covered by insurance. and Rewetting Drops such as Systane,TheraTears, etc  -Avoid eye drops that say red eye relief as they may contain medications that dry out your eyes. . Mild persistent Asthma: - your lung testing today showed mild restriction but did improve with albuterol, concerning for asthma - Controller Inhaler: Continue Advair 500 cmg 1 puff twice a day; This Should Be Used Everyday - Rinse mouth out after use - Rescue Inhaler: Albuterol (Proair/Ventolin) 2 puffs . Use  every 4-6 hours as needed for chest tightness, wheezing, or coughing.  Can also use 15 minutes prior to exercise if you have symptoms with activity. - Asthma is not controlled if:  - Symptoms are occurring >2 times a week OR  - >2 times a month nighttime awakenings  - You are requiring systemic steroids (prednisone/steroid injections) more than once per year  - Your require hospitalization for your asthma.  - Please call the clinic to schedule a follow up if these  symptoms arise  Atopic Dermatitis:  Daily Care For Maintenance (daily and continue even once eczema controlled) - Use hypoallergenic hydrating ointment at least twice daily.  This must be done daily for control of flares. (Great options include Vaseline, CeraVe, Aquaphor, Aveeno, Cetaphil, VaniCream, etc) - Avoid detergents, soaps or lotions with fragrances/dyes - Limit showers/baths to 5 minutes and use luke warm water instead of hot, pat dry following baths, and apply moisturizer - can use steroid/non-steroid therapy creams as detailed below up to twice weekly for prevention of flares.  For Flares:(add this to maintenance therapy if needed for flares) First apply steroid/non-steroid treatment creams. Wait 5 minutes then apply moisturizer.  - Hydrocortisone 2.5% to face/body-apply topically twice daily to red, raised areas of skin, followed by moisturizer - Non-steroid treatment options:  Eucrisa 2% apply topically twice daily as needed (can use in place of steroid creams if desires)   Follow up : 3 months, sooner for allergy injections It was a pleasure meeting you in clinic today! Thank you for allowing me to participate in your care.  Tonny Bollman, MD [facility]  Control of Dog or Cat Allergen  Avoidance is the best way to manage a dog or cat allergy. If you have a dog or cat and are allergic to dog or cats, consider removing the dog or cat from the home. If you have a dog or cat but don't want to find it a new home, or if your family wants a pet even though someone in the household is allergic, here are some strategies that may help keep symptoms at bay:  Keep the pet out of your  bedroom and restrict it to only a few rooms. Be advised that keeping the dog or cat in only one room will not limit the allergens to that room. Don't pet, hug or kiss the dog or cat; if you do, wash your hands with soap and water. High-efficiency particulate air (HEPA) cleaners run  continuously in a bedroom or living room can reduce allergen levels over time. Regular use of a high-efficiency vacuum cleaner or a central vacuum can reduce allergen levels. Giving your dog or cat a bath at least once a week can reduce airborne allergen. Control of Mold Allergen   Mold and fungi can grow on a variety of surfaces provided certain temperature and moisture conditions exist.  Outdoor molds grow on plants, decaying vegetation and soil.  The major outdoor mold, Alternaria and Cladosporium, are found in very high numbers during hot and dry conditions.  Generally, a late Summer - Fall peak is seen for common outdoor fungal spores.  Rain will temporarily lower outdoor mold spore count, but counts rise rapidly when the rainy period ends.  The most important indoor molds are Aspergillus and Penicillium.  Dark, humid and poorly ventilated basements are ideal sites for mold growth.  The next most common sites of mold growth are the bathroom and the kitchen.  Outdoor (Seasonal) Mold Control  Use air conditioning and keep windows closed Avoid exposure to decaying vegetation. Avoid leaf raking. Avoid grain handling. Consider wearing a face mask if working in moldy areas.    Indoor (Perennial) Mold Control   Maintain humidity below 50%. Clean washable surfaces with 5% bleach solution. Remove sources e.g. contaminated carpets.

## 2022-11-23 ENCOUNTER — Telehealth: Payer: Self-pay

## 2022-11-23 ENCOUNTER — Other Ambulatory Visit (HOSPITAL_COMMUNITY): Payer: Self-pay

## 2022-11-23 NOTE — Telephone Encounter (Signed)
Asthma/Allergy  PA request received via [facility] for Fluticasone-Salmeterol 500-50MCG/ACT aerosol powder  PA submitted to [facility] and is pending determination  *no notation of trial of other medications  Key: [id number]

## 2022-11-26 ENCOUNTER — Other Ambulatory Visit (HOSPITAL_BASED_OUTPATIENT_CLINIC_OR_DEPARTMENT_OTHER): Payer: Self-pay

## 2022-11-26 MED ORDER — FLUTICASONE FUROATE-VILANTEROL 100-25 MCG/ACT IN AEPB
1.0000 | INHALATION_SPRAY | Freq: Every day | RESPIRATORY_TRACT | 5 refills | Status: DC
  Filled 2022-11-26: qty 60, 30d supply, fill #0

## 2022-11-26 NOTE — Telephone Encounter (Signed)
Sent in refill for pt 

## 2022-11-26 NOTE — Telephone Encounter (Signed)
PA has been DENIED due to:   Per your health plan's criteria, this drug is covered if you meet the following: (1) You have tried (a minimum 30-day supply) or cannot use one of the following: Advair HFA, Breo Ellipta, brand Symbicort. The information provided does not show that you meet the criteria listed above. Please speak with your doctor about your choices

## 2022-11-26 NOTE — Telephone Encounter (Signed)
Breo 200, 1 puff daily. Thanks

## 2022-11-26 NOTE — Addendum Note (Signed)
Addended by: Berna Bue on: 11/26/2022 12:15 PM   Modules accepted: Orders

## 2022-11-26 NOTE — Telephone Encounter (Signed)
Advair 500 pa denied preferred Advair HFA, Breo Ellipta, brand Symbicort.

## 2022-11-28 ENCOUNTER — Other Ambulatory Visit (HOSPITAL_COMMUNITY): Payer: Self-pay

## 2022-11-28 NOTE — Telephone Encounter (Signed)
ERROR

## 2023-01-07 ENCOUNTER — Encounter: Payer: Self-pay | Admitting: Internal Medicine

## 2023-01-09 ENCOUNTER — Other Ambulatory Visit (HOSPITAL_COMMUNITY): Payer: Self-pay

## 2023-01-09 ENCOUNTER — Other Ambulatory Visit: Payer: Self-pay

## 2023-01-09 ENCOUNTER — Telehealth: Payer: Self-pay

## 2023-01-09 NOTE — Telephone Encounter (Signed)
Okay-let me know when schedule. I will not write orders until then.

## 2023-01-09 NOTE — Telephone Encounter (Signed)
Pt called and is interested in [facility] and codes was given, she will call back to schedule appt.

## 2023-01-14 ENCOUNTER — Other Ambulatory Visit: Payer: Self-pay

## 2023-01-15 ENCOUNTER — Other Ambulatory Visit: Payer: Self-pay | Admitting: Internal Medicine

## 2023-01-15 DIAGNOSIS — J3089 Other allergic rhinitis: Secondary | ICD-10-CM

## 2023-01-15 NOTE — Telephone Encounter (Signed)
[facility] written. 

## 2023-01-15 NOTE — Progress Notes (Signed)
Encounter created for [facility] 

## 2023-01-15 NOTE — Progress Notes (Signed)
VIALS EXP 01-15-24 

## 2023-01-15 NOTE — Progress Notes (Signed)
Aeroallergen Immunotherapy   Ordering Provider: Dr. Tonny Bollman   Patient Details  Name: Amy [name]  MRN: [id number]  Date of Birth: Dec 23, 1980   Order 1 of 1   Vial Label: C/M   0.2 ml (Volume)  1:20 Concentration -- Bipolaris sorokiniana  0.2 ml (Volume)  1:20 Concentration -- Drechslera spicifera  0.5 ml (Volume)  1:10 Concentration -- Cat Hair    0.9  ml Extract Subtotal  4.1  ml Diluent  5.0  ml Maintenance Total   Schedule:  [facility]  Silver Vial (1:1,000,000): [facility]  Blue Vial (1:100,000): [facility]  Yellow Vial (1:10,000): [facility]  Green Vial (1:1,000): Schedule B (6 doses)  Red Vial (1:100): Schedule A (10 doses)   Special Instructions: [facility], green B, red A

## 2023-01-16 DIAGNOSIS — J3081 Allergic rhinitis due to animal (cat) (dog) hair and dander: Secondary | ICD-10-CM | POA: Diagnosis not present

## 2023-02-01 ENCOUNTER — Other Ambulatory Visit: Payer: Self-pay

## 2023-02-01 MED ORDER — PREDNISONE 20 MG PO TABS
ORAL_TABLET | ORAL | 0 refills | Status: DC
  Filled 2023-02-01 – 2023-02-04 (×2): qty 4, 2d supply, fill #0

## 2023-02-01 MED ORDER — EPINEPHRINE 0.3 MG/0.3ML IJ SOAJ
0.3000 mg | INTRAMUSCULAR | 0 refills | Status: DC | PRN
  Filled 2023-02-01: qty 2, 2d supply, fill #0
  Filled 2023-02-04: qty 2, 30d supply, fill #0

## 2023-02-01 MED ORDER — FAMOTIDINE 20 MG PO TABS
ORAL_TABLET | ORAL | 0 refills | Status: DC
  Filled 2023-02-01 – 2023-02-04 (×2): qty 4, 2d supply, fill #0

## 2023-02-01 NOTE — Telephone Encounter (Signed)
Pre-meds sent to [facility], pt notify via MyChart.

## 2023-02-02 ENCOUNTER — Other Ambulatory Visit: Payer: Self-pay

## 2023-02-04 ENCOUNTER — Other Ambulatory Visit (HOSPITAL_BASED_OUTPATIENT_CLINIC_OR_DEPARTMENT_OTHER): Payer: Self-pay

## 2023-02-04 ENCOUNTER — Other Ambulatory Visit (HOSPITAL_COMMUNITY): Payer: Self-pay

## 2023-02-04 ENCOUNTER — Other Ambulatory Visit: Payer: Self-pay

## 2023-02-05 ENCOUNTER — Ambulatory Visit: Payer: Commercial Managed Care - PPO | Admitting: Internal Medicine

## 2023-02-05 VITALS — BP 120/76 | HR 90 | Temp 97.5°F | Resp 16

## 2023-02-05 DIAGNOSIS — J3089 Other allergic rhinitis: Secondary | ICD-10-CM

## 2023-02-05 NOTE — Progress Notes (Unsigned)
RAPID DESENSITIZATION Note  RE: Amy [name] MRN: [id number] DOB: 09/15/1980 Date of Office Visit: 02/05/2023  Subjective:  Patient presents today for rapid desensitization.  Interval History: Patient has not been ill, she has taken all premedications as per protocol.  Recent/Current History: Pulmonary disease: no Cardiac disease: no Respiratory infection: no Rash: no Itch: no Swelling: no Cough: no Shortness of breath: no Runny/stuffy nose: no Itchy eyes: no Beta-blocker use: no  Patient/guardian was informed of the procedure with verbalized understanding of the risk of anaphylaxis. Consent has been signed.   Medication List:  Current Outpatient Medications  Medication Sig Dispense Refill   albuterol (VENTOLIN HFA) 108 (90 Base) MCG/ACT inhaler Inhale 2 puffs into the lungs every 4 (four) hours as needed for wheezing or shortness of breath. 8 g 2   ALPRAZolam (XANAX) 0.5 MG tablet TAKE 1 TABLET BY MOUTH EVERY DAY FOR ANXIETY     bupivacaine (MARCAINE) 0.25 % injection Inject into the skin.     Crisaborole (EUCRISA) 2 % OINT Apply 1 Application topically 2 (two) times daily as needed. 100 g 3   cyclobenzaprine (FLEXERIL) 5 MG tablet Take 5 mg by mouth at bedtime as needed.     EPINEPHrine 0.3 mg/0.3 mL IJ SOAJ injection Inject 0.3 mg into the muscle as needed for anaphylaxis. 2 each 0   escitalopram (LEXAPRO) 10 MG tablet Take 10 mg by mouth daily.     famotidine (PEPCID) 20 MG tablet Take 1 tablet by mouth twice daily Monday and Tuesday before RUSH appt. 4 tablet 0   fluticasone (FLONASE) 50 MCG/ACT nasal spray 1 spray Once Daily.     fluticasone furoate-vilanterol (BREO ELLIPTA) 100-25 MCG/ACT AEPB Inhale 1 puff into the lungs daily. 60 each 5   fluticasone-salmeterol (ADVAIR) 500-50 MCG/ACT AEPB Inhale 1 puff into the lungs in the morning and at bedtime. 1 each 5   hydrocortisone (ANUSOL-HC) 25 MG suppository INSERT ONE SUPPOSITORY RECTALLY TWICE A DAY FOR 6 DAYS      hydrocortisone 2.5 % ointment Apply topically twice daily as need to red sandpapery rash. 30 g 3   Lactobacillus Rhamnosus, GG, (CULTURELLE) CAPS Take 1 capsule by mouth daily.     levocetirizine (XYZAL) 5 MG tablet Take 1 tablet (5 mg total) by mouth every evening. 32 tablet 5   methocarbamol (ROBAXIN) 500 MG tablet TAKE ONE TABLET BY MOUTH FOUR TIMES A DAY **DO NOT TAKE WITHIN 6 HOURS OF CYCLOBENZAPRINE**     montelukast (SINGULAIR) 10 MG tablet Take 1 tablet (10 mg total) by mouth daily. 32 tablet 5   Naltrexone-buPROPion HCl ER (CONTRAVE) 8-90 MG TB12 Take 2 tablets by mouth 2 (two) times daily.     Olopatadine HCl 0.2 % SOLN Apply 2 drops to eye 2 (two) times daily as needed. 2.5 mL 5   Olopatadine-Mometasone (RYALTRIS) 665-25 MCG/ACT SUSP Place 1-2 sprays into the nose 2 (two) times daily. 29 g 5   ondansetron (ZOFRAN-ODT) 4 MG disintegrating tablet Take by mouth.     predniSONE (DELTASONE) 20 MG tablet Take 2 tablets by mouth Monday and Tuesday morning before RUSH appt. 4 tablet 0   rizatriptan (MAXALT) 10 MG tablet Take 10 mg by mouth as needed for migraine. May repeat in 2 hours if needed     traZODone (DESYREL) 50 MG tablet Take 50 mg by mouth at bedtime.     triamcinolone acetonide (KENALOG-40) 40 MG/ML injection Inject into the articular space.     zolpidem (AMBIEN) 5 MG  tablet Take 10 mg by mouth at bedtime as needed for sleep.     No current facility-administered medications for this visit.   Allergies: Allergies  Allergen Reactions   Codeine Nausea And Vomiting    Patients states she is able to take it with antinausea medications   Tramadol Other (See Comments)    Headaches   Clindamycin/Lincomycin Rash   I reviewed her past medical history, social history, family history, and environmental history and no significant changes have been reported from her previous visit.  ROS: Negative except as per HPI.  Objective: There were no vitals taken for this visit. There is no  height or weight on file to calculate BMI.   General Appearance:  Alert, cooperative, no distress, appears stated age  Head:  Normocephalic, without obvious abnormality, atraumatic  Eyes:  Conjunctiva clear, EOM's intact  Nose: Nares normal  Throat: Lips, tongue normal; teeth and gums normal, normal  posterior oropharnyx  Neck: Supple, symmetrical  Lungs:   CTAB , Respirations unlabored, no coughing  Heart:  Appears well perfused  Extremities: No edema  Skin: Skin color, texture, turgor normal, no rashes or lesions on visualized portions of skin  Neurologic: No gross deficits     Diagnostics:  PROCEDURES:  Patient received the following doses every hour: Step 1:  0.68ml - 1:1,000,000 dilution (silver vial) Step 2:  0.61ml - 1:1,000,000 dilution (silver vial) Step 3: 0.13ml - 1:100,000 dilution (blue vial)  Step 4: 0.59ml - 1:100,000 dilution (blue vial)  Step 5: 0.31ml - 1:10,000 dilution (gold vial) Step 6: 0.59ml - 1:10,000 dilution (gold vial) Step 7: 0.23ml - 1:10,000 dilution (gold vial) Step 8: 0.42ml - 1:10,000 dilution (gold vial)  Patient was observed for 1 hour after the last dose.   Procedure started at 8:30  Procedure ended at 3:15   ASSESSMENT/PLAN:   Patient has tolerated the rapid desensitization protocol.  Next appointment: Start at 0.10ml of 1:1000 dilution (green vial) and build up per protocol.

## 2023-02-12 ENCOUNTER — Ambulatory Visit (INDEPENDENT_AMBULATORY_CARE_PROVIDER_SITE_OTHER): Payer: Commercial Managed Care - PPO

## 2023-02-12 DIAGNOSIS — J309 Allergic rhinitis, unspecified: Secondary | ICD-10-CM

## 2023-02-22 ENCOUNTER — Encounter: Payer: Self-pay | Admitting: Internal Medicine

## 2023-02-22 ENCOUNTER — Ambulatory Visit (INDEPENDENT_AMBULATORY_CARE_PROVIDER_SITE_OTHER): Payer: Commercial Managed Care - PPO | Admitting: Internal Medicine

## 2023-02-22 ENCOUNTER — Ambulatory Visit: Payer: Self-pay

## 2023-02-22 VITALS — BP 128/76 | HR 93 | Temp 98.1°F | Resp 18

## 2023-02-22 DIAGNOSIS — Z888 Allergy status to other drugs, medicaments and biological substances status: Secondary | ICD-10-CM | POA: Diagnosis not present

## 2023-02-22 DIAGNOSIS — J3089 Other allergic rhinitis: Secondary | ICD-10-CM

## 2023-02-22 DIAGNOSIS — L710 Perioral dermatitis: Secondary | ICD-10-CM | POA: Diagnosis not present

## 2023-02-22 DIAGNOSIS — J309 Allergic rhinitis, unspecified: Secondary | ICD-10-CM

## 2023-02-22 DIAGNOSIS — J4530 Mild persistent asthma, uncomplicated: Secondary | ICD-10-CM

## 2023-02-22 MED ORDER — LEVOCETIRIZINE DIHYDROCHLORIDE 5 MG PO TABS: 5.0000 mg | ORAL_TABLET | Freq: Every evening | ORAL | 5 refills | Status: DC

## 2023-02-22 MED ORDER — OLOPATADINE HCL 0.2 % OP SOLN: 2.0000 [drp] | Freq: Two times a day (BID) | OPHTHALMIC | 5 refills | Status: DC | PRN

## 2023-02-22 MED ORDER — RYALTRIS 665-25 MCG/ACT NA SUSP: 1.0000 | Freq: Two times a day (BID) | NASAL | 5 refills | Status: DC

## 2023-02-22 MED ORDER — AIRSUPRA 90-80 MCG/ACT IN AERO: 2.0000 | INHALATION_SPRAY | RESPIRATORY_TRACT | 3 refills | Status: DC | PRN

## 2023-02-22 MED ORDER — FLUTICASONE-SALMETEROL 500-50 MCG/ACT IN AEPB: 1.0000 | INHALATION_SPRAY | Freq: Two times a day (BID) | RESPIRATORY_TRACT | 5 refills | Status: DC

## 2023-02-22 MED ORDER — MONTELUKAST SODIUM 10 MG PO TABS: 10.0000 mg | ORAL_TABLET | Freq: Every day | ORAL | 5 refills | Status: DC

## 2023-02-22 MED ORDER — TACROLIMUS 0.1 % EX OINT: TOPICAL_OINTMENT | Freq: Two times a day (BID) | CUTANEOUS | 1 refills | Status: DC | PRN

## 2023-02-22 NOTE — Progress Notes (Signed)
FOLLOW UP Date of Service/Encounter:  02/22/23  Subjective:  Amy [name] (DOB: 31-Mar-1981) is a 42 y.o. female [facility] in re-evaluation of the following: allergic rhinoconjunctivitis, perioral dermatitis, asthma History obtained from: chart review and patient.  For Review, LV was on 11/22/22  with Dr.Janay Canan seen for intial visit for asthma, perioral dermatitis, allergic rhinitis . See below for summary of history and diagnostics.   Therapeutic plans/changes recommended: Started on Ryaltris, continue Singulair and daily over-the-counter antihistamine.  Over-the-counter allergy eyedrops when needed.  Continue on Advair 500, 1 puff twice a day.  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  Throat tightness and trouble getting air in.  1 course of OCS in 2024.  No prior hospitalizations.  Up-to-date with vaccines.  No smoke exposure. AEC (02/03/22): 100   CXR on (05/06/20): read as normal, images not available for review  Continued on Advair 500, 1 puff twice daily and Singulair daily. -Nonobstructive ratio with low FEV1 with significant bronchodilator response.  0.78 ratio pre-, 0.92 post, FEV1 73% pre-, 91% post Allergic Rhinitis:  Started in adulthood.  Symptoms include headache, cough, congestion, rhinorrhea, dry eyes.  Worse in spring and fall.  No prior ENT surgeries triggered by irritants. - SPT environmental panel (11/22/22): positive to cat, indoor/outdoor molds, negative IDs.  Rash AIT initiated 02/05/2023. Perioral dermatitis Small bumps around mouth on occasion --------------------------------------------------- Today presents for follow-up. She is doing well since last visit.  Using her Advair 500 mcg, 1 puff twice  daily, but has had some issues with coverage.  Her last inhaler cost $200.  She continues to use her albuterol in the setting of exercise and occasionally when she is working outside.  She reports that she uses it usually less than 1-2 times per week.  She has not required any systemic steroids or antibiotics since her last visit. She did start Rush AIT last month and tolerated this well.  She is receiving her second injection postRUSH today.  She seems to be tolerating these well.  She does feel that the Ryaltris nasal spray is helping her, and she continues on Singulair and Xyzal prescribed at last visit.  She does use Pataday eyedrops as needed. Today she does have the small bumps around her mouth.  She reports that the [place] was not covered, but she did try and it seemed to help.  All medications reviewed by clinical staff and updated in chart. No new pertinent medical or surgical history except as noted in HPI.  ROS: All others negative except as noted per HPI.   Objective:  BP 128/76   Pulse 93   Temp 98.1 F (36.7 C) (Temporal)   Resp 18   SpO2 97%  There is no height or weight on file to calculate BMI. Physical Exam: General Appearance:  Alert, cooperative, no distress, appears stated age  Head:  Normocephalic, without obvious abnormality, atraumatic  Eyes:  Conjunctiva clear, EOM's intact  Ears EACs normal bilaterally and normal TMs bilaterally  Nose: Nares normal, hypertrophic turbinates, normal mucosa, and no visible anterior polyps  Throat: Lips, tongue normal; teeth and gums normal, normal posterior oropharynx  Neck: Supple, symmetrical  Lungs:   clear to auscultation bilaterally, Respirations unlabored, no coughing  Heart:  regular rate and rhythm and no murmur, Appears well perfused  Extremities: No  edema  Skin: Small erythematous pinpoint papules over upper lip  Neurologic: No gross deficits   Labs:  Lab Orders  No laboratory test(s) ordered today     Assessment/Plan    Perennial Allergic Rhinitis. Improved, not at goal - allergy testing positive to cat, indoor/outdoor molds - Prevention:  - allergen avoidance when possible - continue allergy injections per protocol; have access to epinephrine autoinjector on days of your injections - Symptom control: - Continue Ryaltris 1-2 sprays in each nostril twice a day as needed for nasal congestion/itchy nose - Continue Singulair (Montelukast) 10mg  nightly.   - Discontinue if nightmares of behavior changes. - Continue Antihistamine: daily or daily as needed.   -Options include Zyrtec (Cetirizine) 10mg , Claritin (Loratadine) 10mg , Allegra (Fexofenadine) 180mg , or Xyzal (Levocetirinze) 5mg  - Can be purchased over-the-counter if not covered by insurance.  Allergic Conjunctivitis: at goal - Consider Allergy Eye drops-great options include Pataday (Olopatadine) or Zaditor (ketotifen) for eye symptoms daily as needed-both sold over the counter if not covered by insurance. and Rewetting Drops such as Systane,TheraTears, etc  -Avoid eye drops that say red eye relief as they may contain medications that dry out your eyes. . Mild persistent Asthma: at goal - Controller Inhaler: Continue Advair 500 mcg 1 puff twice a day; This Should Be Used Everyday - Rinse mouth out after use - Rescue Inhaler: Airsupra 2 puffs. Use  every 4-6 hours as needed for chest tightness, wheezing, or coughing.  Can also use 15 minutes prior to exercise if you have symptoms with activity. - Asthma is not controlled if:  - Symptoms are occurring >2 times a week OR  - >2 times a month nighttime awakenings  - You are requiring systemic steroids (prednisone/steroid injections) more than once per year  - Your require hospitalization for your asthma.  - Please call the clinic to schedule a follow up if these symptoms arise  Perioral dermatitis: not at goal - Non-steroid treatment options:  Eucrisa 2% or protopic 1% (will send  to see if covered): apply topically twice daily as needed (can use in place of steroid creams if desires)   Follow up : 6 months, sooner for allergy injections It was a pleasure seeing you again in clinic today! Thank you for allowing me to participate in your care.  Other: samples provided of: eucrisa, airsupra and allergy injection given in clinic today  Tonny Bollman, MD  [facility]

## 2023-02-22 NOTE — Patient Instructions (Addendum)
Perennial Allergic Rhinitis. - allergy testing positive to cat, indoor/outdoor molds - Prevention:  - allergen avoidance when possible - continue allergy injections per protocol - Symptom control: - Continue Ryaltris 1-2 sprays in each nostril twice a day as needed for nasal congestion/itchy nose - Continue Singulair (Montelukast) 10mg  nightly.   - Discontinue if nightmares of behavior changes. - Continue Antihistamine: daily or daily as needed.   -Options include Zyrtec (Cetirizine) 10mg , Claritin (Loratadine) 10mg , Allegra (Fexofenadine) 180mg , or Xyzal (Levocetirinze) 5mg  - Can be purchased over-the-counter if not covered by insurance.  Allergic Conjunctivitis:  - Consider Allergy Eye drops-great options include Pataday (Olopatadine) or Zaditor (ketotifen) for eye symptoms daily as needed-both sold over the counter if not covered by insurance. and Rewetting Drops such as Systane,TheraTears, etc  -Avoid eye drops that say red eye relief as they may contain medications that dry out your eyes. . Mild persistent Asthma: - Controller Inhaler: Continue Advair 500 mcg 1 puff twice a day; This Should Be Used Everyday - Rinse mouth out after use - Rescue Inhaler:  Airsupra 2 puffs . Use  every 4-6 hours as needed for chest tightness, wheezing, or coughing.  Can also use 15 minutes prior to exercise if you have symptoms with activity. - Asthma is not controlled if:  - Symptoms are occurring >2 times a week OR  - >2 times a month nighttime awakenings  - You are requiring systemic steroids (prednisone/steroid injections) more than once per year  - Your require hospitalization for your asthma.  - Please call the clinic to schedule a follow up if these symptoms arise  Perioral dermatitis:  - Non-steroid treatment options:  Eucrisa 2% or protopic 1% (will send to see if covered): apply topically twice daily as needed (can use in place of steroid creams if desires)   Follow up : 6 months, sooner  for allergy injections It was a pleasure seeing you again in clinic today! Thank you for allowing me to participate in your care.  Tonny Bollman, MD [facility]  Control of Dog or Cat Allergen  Avoidance is the best way to manage a dog or cat allergy. If you have a dog or cat and are allergic to dog or cats, consider removing the dog or cat from the home. If you have a dog or cat but don't want to find it a new home, or if your family wants a pet even though someone in the household is allergic, here are some strategies that may help keep symptoms at bay:  Keep the pet out of your bedroom and restrict it to only a few rooms. Be advised that keeping the dog or cat in only one room will not limit the allergens to that room. Don't pet, hug or kiss the dog or cat; if you do, wash your hands with soap and water. High-efficiency particulate air (HEPA) cleaners run continuously in a bedroom or living room can reduce allergen levels over time. Regular use of a high-efficiency vacuum cleaner or a central vacuum can reduce allergen levels. Giving your dog or cat a bath at least once a week can reduce airborne allergen. Control of Mold Allergen   Mold and fungi can grow on a variety of surfaces provided certain temperature and moisture conditions exist.  Outdoor molds grow on plants, decaying vegetation and soil.  The major outdoor mold, Alternaria and Cladosporium, are found in very high numbers during hot and dry conditions.  Generally, a late Summer -  Fall peak is seen for common outdoor fungal spores.  Rain will temporarily lower outdoor mold spore count, but counts rise rapidly when the rainy period ends.  The most important indoor molds are Aspergillus and Penicillium.  Dark, humid and poorly ventilated basements are ideal sites for mold growth.  The next most common sites of mold growth are the bathroom and the kitchen.  Outdoor (Seasonal) Mold Control  Use air conditioning and  keep windows closed Avoid exposure to decaying vegetation. Avoid leaf raking. Avoid grain handling. Consider wearing a face mask if working in moldy areas.    Indoor (Perennial) Mold Control   Maintain humidity below 50%. Clean washable surfaces with 5% bleach solution. Remove sources e.g. contaminated carpets.

## 2023-02-25 ENCOUNTER — Other Ambulatory Visit (HOSPITAL_COMMUNITY): Payer: Self-pay

## 2023-02-25 ENCOUNTER — Telehealth: Payer: Self-pay

## 2023-02-25 NOTE — Telephone Encounter (Signed)
*  Asthma/Allergy  Pharmacy Patient Advocate Encounter   Received notification from CoverMyMeds that prior authorization for Wixela Inhub 500-50MCG/ACT aerosol powder  is required/requested.   Insurance verification completed.   The patient is insured through [facility] .   Per test claim: PA required; PA submitted to [facility] via CoverMyMeds Key/confirmation #/[id number] Status is pending

## 2023-02-25 NOTE — Telephone Encounter (Signed)
Pharmacy Patient Advocate Encounter  Received notification from [facility] that Prior Authorization for Amy [name] has been APPROVED from 02/25/2023 to 02/25/2024

## 2023-02-27 ENCOUNTER — Encounter: Payer: Self-pay | Admitting: Internal Medicine

## 2023-02-27 ENCOUNTER — Ambulatory Visit (INDEPENDENT_AMBULATORY_CARE_PROVIDER_SITE_OTHER): Payer: Commercial Managed Care - PPO

## 2023-02-27 DIAGNOSIS — J309 Allergic rhinitis, unspecified: Secondary | ICD-10-CM

## 2023-03-05 ENCOUNTER — Ambulatory Visit: Payer: Self-pay

## 2023-03-05 DIAGNOSIS — J309 Allergic rhinitis, unspecified: Secondary | ICD-10-CM

## 2023-03-12 ENCOUNTER — Ambulatory Visit (INDEPENDENT_AMBULATORY_CARE_PROVIDER_SITE_OTHER): Payer: Commercial Managed Care - PPO

## 2023-03-12 DIAGNOSIS — J309 Allergic rhinitis, unspecified: Secondary | ICD-10-CM | POA: Diagnosis not present

## 2023-03-19 ENCOUNTER — Ambulatory Visit (INDEPENDENT_AMBULATORY_CARE_PROVIDER_SITE_OTHER): Payer: Commercial Managed Care - PPO

## 2023-03-19 DIAGNOSIS — J309 Allergic rhinitis, unspecified: Secondary | ICD-10-CM

## 2023-03-21 ENCOUNTER — Encounter: Payer: Self-pay | Admitting: Internal Medicine

## 2023-03-22 ENCOUNTER — Other Ambulatory Visit: Payer: Self-pay | Admitting: Internal Medicine

## 2023-03-22 ENCOUNTER — Other Ambulatory Visit (HOSPITAL_BASED_OUTPATIENT_CLINIC_OR_DEPARTMENT_OTHER): Payer: Self-pay

## 2023-03-22 ENCOUNTER — Other Ambulatory Visit: Payer: Self-pay

## 2023-03-22 MED ORDER — AZELASTINE-FLUTICASONE 137-50 MCG/ACT NA SUSP
1.0000 | Freq: Two times a day (BID) | NASAL | 5 refills | Status: DC | PRN
  Filled 2023-03-22 (×2): qty 23, 30d supply, fill #0

## 2023-03-22 NOTE — Progress Notes (Signed)
Encounter created to switch to dymista nasal spray from ryaltris. Taste bothering patient.

## 2023-03-28 ENCOUNTER — Ambulatory Visit (INDEPENDENT_AMBULATORY_CARE_PROVIDER_SITE_OTHER): Payer: Commercial Managed Care - PPO

## 2023-03-28 DIAGNOSIS — J309 Allergic rhinitis, unspecified: Secondary | ICD-10-CM | POA: Diagnosis not present

## 2023-04-05 ENCOUNTER — Ambulatory Visit (INDEPENDENT_AMBULATORY_CARE_PROVIDER_SITE_OTHER): Payer: Commercial Managed Care - PPO | Admitting: *Deleted

## 2023-04-05 DIAGNOSIS — J309 Allergic rhinitis, unspecified: Secondary | ICD-10-CM

## 2023-04-09 ENCOUNTER — Ambulatory Visit (INDEPENDENT_AMBULATORY_CARE_PROVIDER_SITE_OTHER): Payer: Commercial Managed Care - PPO

## 2023-04-09 DIAGNOSIS — J309 Allergic rhinitis, unspecified: Secondary | ICD-10-CM

## 2023-04-16 ENCOUNTER — Ambulatory Visit (INDEPENDENT_AMBULATORY_CARE_PROVIDER_SITE_OTHER): Payer: Commercial Managed Care - PPO

## 2023-04-16 DIAGNOSIS — J309 Allergic rhinitis, unspecified: Secondary | ICD-10-CM

## 2023-04-18 ENCOUNTER — Other Ambulatory Visit: Payer: Self-pay | Admitting: Medical Genetics

## 2023-04-18 DIAGNOSIS — Z006 Encounter for examination for normal comparison and control in clinical research program: Secondary | ICD-10-CM

## 2023-04-23 ENCOUNTER — Ambulatory Visit (INDEPENDENT_AMBULATORY_CARE_PROVIDER_SITE_OTHER): Payer: Commercial Managed Care - PPO

## 2023-04-23 DIAGNOSIS — J309 Allergic rhinitis, unspecified: Secondary | ICD-10-CM | POA: Diagnosis not present

## 2023-04-30 ENCOUNTER — Ambulatory Visit (INDEPENDENT_AMBULATORY_CARE_PROVIDER_SITE_OTHER): Payer: Commercial Managed Care - PPO

## 2023-04-30 DIAGNOSIS — J309 Allergic rhinitis, unspecified: Secondary | ICD-10-CM | POA: Diagnosis not present

## 2023-05-07 ENCOUNTER — Ambulatory Visit (INDEPENDENT_AMBULATORY_CARE_PROVIDER_SITE_OTHER): Payer: Commercial Managed Care - PPO | Admitting: *Deleted

## 2023-05-07 DIAGNOSIS — J309 Allergic rhinitis, unspecified: Secondary | ICD-10-CM | POA: Diagnosis not present

## 2023-05-13 ENCOUNTER — Other Ambulatory Visit: Payer: Self-pay

## 2023-05-13 MED ORDER — LEVOCETIRIZINE DIHYDROCHLORIDE 5 MG PO TABS: 5.0000 mg | ORAL_TABLET | Freq: Every evening | ORAL | 5 refills | Status: DC

## 2023-05-14 ENCOUNTER — Ambulatory Visit (INDEPENDENT_AMBULATORY_CARE_PROVIDER_SITE_OTHER): Payer: Commercial Managed Care - PPO

## 2023-05-14 DIAGNOSIS — J309 Allergic rhinitis, unspecified: Secondary | ICD-10-CM | POA: Diagnosis not present

## 2023-05-15 ENCOUNTER — Other Ambulatory Visit: Payer: Self-pay

## 2023-05-15 MED ORDER — LEVOCETIRIZINE DIHYDROCHLORIDE 5 MG PO TABS: 5.0000 mg | ORAL_TABLET | Freq: Every evening | ORAL | 5 refills | Status: DC

## 2023-05-17 ENCOUNTER — Encounter (HOSPITAL_BASED_OUTPATIENT_CLINIC_OR_DEPARTMENT_OTHER): Payer: Self-pay

## 2023-05-17 ENCOUNTER — Other Ambulatory Visit: Payer: Self-pay

## 2023-05-17 ENCOUNTER — Emergency Department (HOSPITAL_BASED_OUTPATIENT_CLINIC_OR_DEPARTMENT_OTHER): Payer: Commercial Managed Care - PPO

## 2023-05-17 ENCOUNTER — Encounter: Payer: Self-pay | Admitting: Internal Medicine

## 2023-05-17 ENCOUNTER — Emergency Department (HOSPITAL_BASED_OUTPATIENT_CLINIC_OR_DEPARTMENT_OTHER)
Admission: EM | Admit: 2023-05-17 | Discharge: 2023-05-17 | Disposition: A | Discharge status: Home / Self Care | Payer: Commercial Managed Care - PPO | Attending: Emergency Medicine | Admitting: Emergency Medicine

## 2023-05-17 DIAGNOSIS — Z23 Encounter for immunization: Secondary | ICD-10-CM | POA: Diagnosis not present

## 2023-05-17 DIAGNOSIS — Z7952 Long term (current) use of systemic steroids: Secondary | ICD-10-CM | POA: Diagnosis not present

## 2023-05-17 DIAGNOSIS — J45909 Unspecified asthma, uncomplicated: Secondary | ICD-10-CM | POA: Diagnosis not present

## 2023-05-17 DIAGNOSIS — R519 Headache, unspecified: Secondary | ICD-10-CM | POA: Insufficient documentation

## 2023-05-17 DIAGNOSIS — S51011A Laceration without foreign body of right elbow, initial encounter: Secondary | ICD-10-CM | POA: Insufficient documentation

## 2023-05-17 DIAGNOSIS — W19XXXA Unspecified fall, initial encounter: Secondary | ICD-10-CM

## 2023-05-17 DIAGNOSIS — Z7951 Long term (current) use of inhaled steroids: Secondary | ICD-10-CM | POA: Diagnosis not present

## 2023-05-17 DIAGNOSIS — W010XXA Fall on same level from slipping, tripping and stumbling without subsequent striking against object, initial encounter: Secondary | ICD-10-CM | POA: Diagnosis not present

## 2023-05-17 MED ORDER — BACITRACIN ZINC 500 UNIT/GM EX OINT
TOPICAL_OINTMENT | Freq: Two times a day (BID) | CUTANEOUS | Status: DC
  Administered 2023-05-17: 31.5 via TOPICAL
  Filled 2023-05-17: qty 28.35

## 2023-05-17 MED ORDER — TETANUS-DIPHTH-ACELL PERTUSSIS 5-2.5-18.5 LF-MCG/0.5 IM SUSY
0.5000 mL | PREFILLED_SYRINGE | Freq: Once | INTRAMUSCULAR | Status: AC
  Administered 2023-05-17: 0.5 mL via INTRAMUSCULAR
  Filled 2023-05-17: qty 0.5

## 2023-05-17 MED ORDER — CEPHALEXIN 500 MG PO CAPS: 500.0000 mg | ORAL_CAPSULE | Freq: Four times a day (QID) | ORAL | 0 refills | Status: DC

## 2023-05-17 MED ORDER — LIDOCAINE-EPINEPHRINE (PF) 2 %-1:200000 IJ SOLN
10.0000 mL | Freq: Once | INTRAMUSCULAR | Status: AC
  Administered 2023-05-17: 10 mL
  Filled 2023-05-17: qty 20

## 2023-05-17 MED ORDER — ACETAMINOPHEN 325 MG PO TABS
650.0000 mg | ORAL_TABLET | Freq: Once | ORAL | Status: AC
  Administered 2023-05-17: 650 mg via ORAL
  Filled 2023-05-17: qty 2

## 2023-05-17 NOTE — Discharge Instructions (Addendum)
Please follow-up with your primary care provider in regards to recent ER visit.  Today you have 1 stitch placed no need removed in 7 days.  In the meantime please keep the wound clean and dry and you may use Neosporin ointment and keep it covered.  As we agreed I am discharging you before the x-ray is read.  Please follow-up on the MyChart app in regards to the x-ray read and in the meantime I have given you prescription for antibiotics to take if the x-ray shows that you have a fracture.  You may use Tylenol every 6 hours needed for pain.  If symptoms change or worsen please return to the ER.

## 2023-05-17 NOTE — ED Triage Notes (Signed)
The patient tripped and fell. She has right elbow pain.

## 2023-05-17 NOTE — ED Provider Notes (Signed)
[facility] Provider Note   CSN: [id number] Arrival date & time: 05/17/23  1959     History  Chief Complaint  Patient presents with   Fall   Elbow Pain    Amy [name] is a 42 y.o. female history of anxiety, asthma, seizures presented after a fall that occurred earlier this evening.  Patient tripped over a rug at her school and landed on her right elbow.  Patient is unsure if she hit her head or not but states she does have a mild headache however this been going on all day.  Patient denies a change in sensation/motor skills, blood thinners, bleeding disorders, vision changes.  Patient states that she does not want to be evaluated for her head as she has more about her right elbow.  Patient is laceration to the extensor side of her right elbow but is stable to fully move her elbow and denies any paresthesias, new onset weakness, skin color changes.  Patient is unsure of last tetanus.  Patient denies any chest pain, shortness of breath, LOC, abdominal pain, nausea/vomiting, seizure-like activity  Home Medications Prior to Admission medications   Medication Sig Start Date End Date Taking? Authorizing Provider  cephALEXin (KEFLEX) 500 MG capsule Take 1 capsule (500 mg total) by mouth 4 (four) times daily. 05/17/23  Yes Vaughn Beaumier, Beverly Gust, PA-C  albuterol (VENTOLIN HFA) 108 (90 Base) MCG/ACT inhaler Inhale 2 puffs into the lungs every 4 (four) hours as needed for wheezing or shortness of breath. 11/22/22   Verlee Monte, MD  Albuterol-Budesonide [employer]) 90-80 MCG/ACT AERO Inhale 2 puffs into the lungs as needed (maximum 12 puffs/day). 02/22/23   Verlee Monte, MD  ALPRAZolam Pr[employer]y [employer]) 0.5 MG tablet TAKE 1 TABLET BY MOUTH EVERY DAY FOR ANXIETY 08/14/22   [provider]  Azelastine-Fluticasone (DYMISTA) 137-50 MCG/ACT SUSP Place 1 spray into both nostrils 2 (two) times daily as needed. 03/22/23   Verlee Monte, MD  bupivacaine (MARCAINE)  0.25 % injection Inject into the skin. 11/07/20   [provider]  Crisaborole (EUCRISA) 2 % OINT Apply 1 Application topically 2 (two) times daily as needed. 11/22/22   Verlee Monte, MD  cyclobenzaprine (FLEXERIL) 5 MG tablet Take 5 mg by mouth at bedtime as needed.    [provider]  EPINEPHrine 0.3 mg/0.3 mL IJ SOAJ injection Inject 0.3 mg into the muscle as needed for anaphylaxis. 02/01/23   Verlee Monte, MD  escitalopram (LEXAPRO) 10 MG tablet Take 10 mg by mouth daily.    [provider]  famotidine (PEPCID) 20 MG tablet Take 1 tablet by mouth twice daily Monday and Tuesday before [facility] appt. 02/01/23   Verlee Monte, MD  fluticasone-salmeterol (ADVAIR) 500-50 MCG/ACT AEPB Inhale 1 puff into the lungs in the morning and at bedtime. 02/22/23   Verlee Monte, MD  hydrocortisone (ANUSOL-HC) 25 MG suppository INSERT ONE SUPPOSITORY RECTALLY TWICE A DAY FOR 6 DAYS 08/14/22   [provider]  hydrocortisone 2.5 % ointment Apply topically twice daily as need to red sandpapery rash. 11/22/22   Verlee Monte, MD  Lactobacillus Rhamnosus, GG, (CULTURELLE) CAPS Take 1 capsule by mouth daily.    [provider]  levocetirizine (XYZAL) 5 MG tablet Take 1 tablet (5 mg total) by mouth every evening. 05/15/23   Verlee Monte, MD  methocarbamol (ROBAXIN) 500 MG tablet TAKE ONE TABLET BY MOUTH FOUR TIMES A DAY **DO NOT TAKE WITHIN  6 HOURS OF CYCLOBENZAPRINE** 12/06/21   [provider]  montelukast (SINGULAIR) 10 MG tablet Take 1 tablet (10 mg total) by mouth daily. 02/22/23   Verlee Monte, MD  Naltrexone-buPROPion HCl ER (CONTRAVE) 8-90 MG TB12 Take 2 tablets by mouth 2 (two) times daily. 08/17/22   [provider]  Olopatadine HCl 0.2 % SOLN Apply 2 drops to eye 2 (two) times daily as needed. 02/22/23   Verlee Monte, MD  Olopatadine-Mometasone [employer]) [phone] MCG/ACT SUSP Place 1-2 sprays into the nose 2 (two) times daily. 02/22/23   Verlee Monte,  MD  ondansetron (ZOFRAN-ODT) 4 MG disintegrating tablet Take by mouth. 05/14/22   [provider]  predniSONE (DELTASONE) 20 MG tablet Take 2 tablets by mouth Monday and Tuesday morning before [facility] appt. 02/01/23   Verlee Monte, MD  rizatriptan (MAXALT) 10 MG tablet Take 10 mg by mouth as needed for migraine. May repeat in 2 hours if needed    [provider]  tacrolimus (PROTOPIC) 0.1 % ointment Apply topically 2 (two) times daily as needed (for perioral rash). 02/22/23   Verlee Monte, MD  traZODone (DESYREL) 50 MG tablet Take 50 mg by mouth at bedtime.    [provider]  triamcinolone acetonide (KENALOG-40) 40 MG/ML injection Inject into the articular space. 11/07/20   [provider]  zolpidem (AMBIEN) 5 MG tablet Take 10 mg by mouth at bedtime as needed for sleep.    [provider]      Allergies    Codeine, Tramadol, and Clindamycin/lincomycin    Review of Systems   Review of Systems  Physical Exam Updated Vital Signs BP (!) 149/98   Pulse 92   Temp 99.1 F (37.3 C) (Oral)   Resp 18   Ht 5\' 6"  (1.676 m)   Wt 77.1 kg   SpO2 99%   BMI 27.44 kg/m  Physical Exam Vitals reviewed.  Constitutional:      General: She is not in acute distress. HENT:     Head: Normocephalic and atraumatic.  Eyes:     Extraocular Movements: Extraocular movements intact.     Conjunctiva/sclera: Conjunctivae normal.     Pupils: Pupils are equal, round, and reactive to light.  Cardiovascular:     Rate and Rhythm: Normal rate and regular rhythm.     Pulses: Normal pulses.     Heart sounds: Normal heart sounds.     Comments: 2+ bilateral radial/dorsalis pedis pulses with regular rate Pulmonary:     Effort: Pulmonary effort is normal. No respiratory distress.     Breath sounds: Normal breath sounds.  Abdominal:     Palpations: Abdomen is soft.     Tenderness: There is no abdominal tenderness. There is no guarding or rebound.  Musculoskeletal:         General: Normal range of motion.     Cervical back: Normal range of motion and neck supple.     Comments: 5 out of 5 bilateral grip/leg extension strength 5 out of 5 right elbow flexion/extension, no bony abnormalities Soft apartments Pain not out of proportion Right elbow does appear slightly edematous without skin color changes or warmth  Skin:    General: Skin is warm and dry.     Capillary Refill: Capillary refill takes less than 2 seconds.     Comments: 2.5 cm laceration noted to right elbow on the extensor side that is superficial in nature not actively hemorrhaging  Neurological:  General: No focal deficit present.     Mental Status: She is alert and oriented to person, place, and time.     Sensory: Sensation is intact.     Motor: Motor function is intact.     Coordination: Coordination is intact.     Gait: Gait is intact.     Comments: Sensation intact in all 4 limbs Vision grossly intact Cranial nerves III through XII intact  Psychiatric:        Mood and Affect: Mood normal.     ED Results / Procedures / Treatments   Labs (all labs ordered are listed, but only abnormal results are displayed) Labs Reviewed - No data to display  EKG None  Radiology DG Elbow Complete Right  Result Date: 05/17/2023 CLINICAL DATA:  Elbow pain after injury.  Laceration. EXAM: RIGHT ELBOW - COMPLETE 3+ VIEW COMPARISON:  None Available. FINDINGS: There is no evidence of fracture, dislocation, or joint effusion. There is no evidence of arthropathy or other focal bone abnormality. Mild soft tissue thickening posteriorly. No radiopaque foreign body. IMPRESSION: Mild soft tissue thickening posteriorly. No fracture or radiopaque foreign body. Electronically Signed   By: Narda Rutherford M.D.   On: 05/17/2023 23:31    Procedures .Marland KitchenLaceration Repair  Date/Time: 05/17/2023 11:11 PM  Performed by: Netta Corrigan, PA-C Authorized by: Netta Corrigan, PA-C   Consent:    Consent obtained:   Verbal   Consent given by:  Patient   Risks, benefits, and alternatives were discussed: yes     Risks discussed:  Infection, need for additional repair, nerve damage, poor wound healing, poor cosmetic result and pain   Alternatives discussed:  No treatment Universal protocol:    Procedure explained and questions answered to patient or proxy's satisfaction: yes     Imaging studies available: yes     Patient identity confirmed:  Verbally with patient Anesthesia:    Anesthesia method:  Local infiltration   Local anesthetic:  Lidocaine 2% WITH epi Laceration details:    Location: Right elbow.   Length (cm):  2.5   Depth (mm):  1 Treatment:    Area cleansed with:  Saline   Amount of cleaning:  Standard   Irrigation solution:  Sterile saline   Irrigation volume:  150 mL   Irrigation method:  Pressure wash   Visualized foreign bodies/material removed: no     Debridement:  None Skin repair:    Repair method:  Sutures   Suture size:  4-0   Suture material:  Prolene   Suture technique:  Simple interrupted   Number of sutures:  1 Approximation:    Approximation:  Close Repair type:    Repair type:  Simple Post-procedure details:    Dressing:  Antibiotic ointment, bulky dressing and non-adherent dressing   Procedure completion:  Tolerated     Medications Ordered in ED Medications  bacitracin ointment (31.5 Applications Topical Given 05/17/23 2325)  Tdap (BOOSTRIX) injection 0.5 mL (0.5 mLs Intramuscular Given 05/17/23 2237)  lidocaine-EPINEPHrine (XYLOCAINE W/EPI) 2 %-1:200000 (PF) injection 10 mL (10 mLs Infiltration Given by Other 05/17/23 2245)  acetaminophen (TYLENOL) tablet 650 mg (650 mg Oral Given 05/17/23 2235)    ED Course/ Medical Decision Making/ A&P                                 Medical Decision Making Amount and/or Complexity of Data Reviewed Radiology: ordered.  Risk OTC drugs.  Prescription drug management.   Arneta Cliche 42 y.o. presented today for  fall. Working DDx that I considered at this time includes, but not limited to, vasovagal episode, mechanical fall, ICH, epidural/subdural hematoma, basilar skull fracture, anemia, electrolyte abnormalities, drug-induced, arrhythmia, UTI, fracture, contusion, soft tissue injury, laceration.  R/o DDx: vasovagal episode, ICH, epidural/subdural hematoma, basilar skull fracture, anemia, electrolyte abnormalities, drug-induced, arrhythmia, UTI, fracture, contusion, soft tissue injury: These are considered less likely due to history of present illness, physical exam, lab/imaging findings  Review of prior external notes: 05/10/23 Office Visit  Unique Tests and My Interpretation:  Right elbow XR: No acute fractures, no fat pads noted  Social Determinants of Health: none  Discussion with Independent Historian: None  Discussion of Management of Tests: None  Risk: Low: based on diagnostic testing/clinical impression and treatment plan  Risk Stratification Score: none  Plan: On exam patient was in no acute distress with stable vitals.  On exam patient does have 2.5 cm laceration to the extensor side of right elbow that is superficial in nature that will be repaired with sutures.  Tetanus is out of date and will be updated today.  Patient states she does not want to be evaluated for her headache.  Patient did have reassuring neurologic exam and does not have any signs of trauma to her head or neck area and denies any neck pain or associated symptoms with her headache.  Patient understands that this may limit the examination may lead to missed diagnosis of full decision-making capacity states she does not want to have a head CT scan done at this time which in regards to her physical exam being reassuring is reasonable.  Will give Tylenol while we await imaging however upon my independent interpretation I do not see any fractures or fat-pad signs that would be indicative of occult fractures.  Patient's right  upper extremity is also neurovascularly intact.  Patient's laceration was repaired without issue with 1 stitch.  Will place bacitracin ointment on wrap and have her follow-up with primary care provider or urgent care or ED to have the stitch removed in 7 days.  Patient was verbally given instructions on how to care for wound at home and what signs symptoms look out for that necessitate return.  I spoke to the patient patient states she is going will be discharged without having the x-ray read by the radiologist.  Upon my independent interpretation I do not see any fractures and so low suspicion that patient has open fracture that will require antibiotics however due to patient having laceration we will give a prescription of Keflex for patient to take and instruct her to fill only if the radiologist read states that she has a fracture and that she will need to follow-up on the MyChart app for this.  Encouraged patient to follow-up with primary care provider.  Patient was given return precautions. Patient stable for discharge at this time.  Patient verbalized understanding of plan.  This chart was dictated using voice recognition software.  Despite best efforts to proofread,  errors can occur which can change the documentation meaning.         Final Clinical Impression(s) / ED Diagnoses Final diagnoses:  Fall, initial encounter  Elbow laceration, right, initial encounter    Rx / DC Orders ED Discharge Orders          Ordered    cephALEXin (KEFLEX) 500 MG capsule  4 times daily        05/17/23  2329              Remi Deter 05/17/23 2334    Jacalyn Lefevre, MD 05/18/23 1438

## 2023-05-20 ENCOUNTER — Ambulatory Visit (INDEPENDENT_AMBULATORY_CARE_PROVIDER_SITE_OTHER): Payer: Commercial Managed Care - PPO

## 2023-05-20 ENCOUNTER — Other Ambulatory Visit: Payer: Self-pay

## 2023-05-20 DIAGNOSIS — J309 Allergic rhinitis, unspecified: Secondary | ICD-10-CM | POA: Diagnosis not present

## 2023-05-20 MED ORDER — LEVOCETIRIZINE DIHYDROCHLORIDE 5 MG PO TABS: 5.0000 mg | ORAL_TABLET | Freq: Every evening | ORAL | 5 refills | Status: DC

## 2023-05-22 DIAGNOSIS — J3081 Allergic rhinitis due to animal (cat) (dog) hair and dander: Secondary | ICD-10-CM | POA: Diagnosis not present

## 2023-05-22 NOTE — Progress Notes (Signed)
VIAL EXP 05-21-24

## 2023-05-28 ENCOUNTER — Ambulatory Visit (INDEPENDENT_AMBULATORY_CARE_PROVIDER_SITE_OTHER): Payer: Commercial Managed Care - PPO

## 2023-05-28 DIAGNOSIS — J309 Allergic rhinitis, unspecified: Secondary | ICD-10-CM | POA: Diagnosis not present

## 2023-06-03 ENCOUNTER — Other Ambulatory Visit (HOSPITAL_COMMUNITY)
Admission: RE | Admit: 2023-06-03 | Discharge: 2023-06-03 | Disposition: A | Discharge status: Home / Self Care | Payer: Commercial Managed Care - PPO | Source: Ambulatory Visit | Attending: Oncology | Admitting: Oncology

## 2023-06-03 DIAGNOSIS — Z006 Encounter for examination for normal comparison and control in clinical research program: Secondary | ICD-10-CM | POA: Insufficient documentation

## 2023-06-04 ENCOUNTER — Other Ambulatory Visit: Payer: Self-pay

## 2023-06-04 MED ORDER — LEVOCETIRIZINE DIHYDROCHLORIDE 5 MG PO TABS: 5.0000 mg | ORAL_TABLET | Freq: Every evening | ORAL | 5 refills | Status: DC

## 2023-06-05 ENCOUNTER — Ambulatory Visit (INDEPENDENT_AMBULATORY_CARE_PROVIDER_SITE_OTHER): Payer: Commercial Managed Care - PPO

## 2023-06-05 DIAGNOSIS — J309 Allergic rhinitis, unspecified: Secondary | ICD-10-CM | POA: Diagnosis not present

## 2023-06-11 ENCOUNTER — Ambulatory Visit (INDEPENDENT_AMBULATORY_CARE_PROVIDER_SITE_OTHER): Payer: Commercial Managed Care - PPO

## 2023-06-11 DIAGNOSIS — J309 Allergic rhinitis, unspecified: Secondary | ICD-10-CM

## 2023-06-18 LAB — GENECONNECT MOLECULAR SCREEN: Genetic Analysis Overall Interpretation: NEGATIVE

## 2023-06-19 ENCOUNTER — Ambulatory Visit (INDEPENDENT_AMBULATORY_CARE_PROVIDER_SITE_OTHER): Payer: Commercial Managed Care - PPO

## 2023-06-19 DIAGNOSIS — J309 Allergic rhinitis, unspecified: Secondary | ICD-10-CM | POA: Diagnosis not present

## 2023-06-24 ENCOUNTER — Ambulatory Visit (INDEPENDENT_AMBULATORY_CARE_PROVIDER_SITE_OTHER): Payer: Commercial Managed Care - PPO

## 2023-06-24 DIAGNOSIS — J309 Allergic rhinitis, unspecified: Secondary | ICD-10-CM

## 2023-07-09 ENCOUNTER — Ambulatory Visit (INDEPENDENT_AMBULATORY_CARE_PROVIDER_SITE_OTHER): Payer: Commercial Managed Care - PPO

## 2023-07-09 DIAGNOSIS — J309 Allergic rhinitis, unspecified: Secondary | ICD-10-CM

## 2023-08-05 ENCOUNTER — Other Ambulatory Visit (HOSPITAL_COMMUNITY): Payer: Self-pay

## 2023-08-08 ENCOUNTER — Ambulatory Visit (INDEPENDENT_AMBULATORY_CARE_PROVIDER_SITE_OTHER): Payer: Commercial Managed Care - PPO

## 2023-08-08 DIAGNOSIS — J309 Allergic rhinitis, unspecified: Secondary | ICD-10-CM | POA: Diagnosis not present
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
## 2023-08-29 ENCOUNTER — Ambulatory Visit: Payer: Commercial Managed Care - PPO | Admitting: Internal Medicine

## 2023-09-05 ENCOUNTER — Ambulatory Visit (INDEPENDENT_AMBULATORY_CARE_PROVIDER_SITE_OTHER): Payer: Commercial Managed Care - PPO

## 2023-09-05 DIAGNOSIS — J309 Allergic rhinitis, unspecified: Secondary | ICD-10-CM

## 2023-09-16 ENCOUNTER — Other Ambulatory Visit: Payer: Self-pay | Admitting: Internal Medicine

## 2023-09-16 ENCOUNTER — Other Ambulatory Visit: Payer: Self-pay

## 2023-09-16 ENCOUNTER — Other Ambulatory Visit (HOSPITAL_BASED_OUTPATIENT_CLINIC_OR_DEPARTMENT_OTHER): Payer: Self-pay

## 2023-09-16 ENCOUNTER — Encounter: Payer: Self-pay | Admitting: Internal Medicine

## 2023-09-16 MED ORDER — IPRATROPIUM BROMIDE 0.06 % NA SOLN
2.0000 | Freq: Three times a day (TID) | NASAL | 5 refills | Status: DC | PRN
  Filled 2023-09-16: qty 15, 25d supply, fill #0

## 2023-09-16 NOTE — Progress Notes (Signed)
 Ipratropium nose spray sent.

## 2023-09-17 ENCOUNTER — Other Ambulatory Visit: Payer: Self-pay

## 2023-09-23 DIAGNOSIS — J3081 Allergic rhinitis due to animal (cat) (dog) hair and dander: Secondary | ICD-10-CM | POA: Diagnosis not present

## 2023-09-23 NOTE — Progress Notes (Signed)
 VIAL MADE 09-23-23

## 2023-10-01 ENCOUNTER — Ambulatory Visit (INDEPENDENT_AMBULATORY_CARE_PROVIDER_SITE_OTHER)

## 2023-10-01 DIAGNOSIS — J309 Allergic rhinitis, unspecified: Secondary | ICD-10-CM | POA: Diagnosis not present

## 2023-11-06 ENCOUNTER — Ambulatory Visit (INDEPENDENT_AMBULATORY_CARE_PROVIDER_SITE_OTHER): Admitting: Internal Medicine

## 2023-11-06 ENCOUNTER — Encounter: Payer: Self-pay | Admitting: Internal Medicine

## 2023-11-06 VITALS — BP 124/86 | HR 98 | Temp 98.3°F | Resp 20 | Wt 175.0 lb

## 2023-11-06 DIAGNOSIS — J3089 Other allergic rhinitis: Secondary | ICD-10-CM

## 2023-11-06 DIAGNOSIS — J4531 Mild persistent asthma with (acute) exacerbation: Secondary | ICD-10-CM

## 2023-11-06 DIAGNOSIS — H1013 Acute atopic conjunctivitis, bilateral: Secondary | ICD-10-CM | POA: Diagnosis not present

## 2023-11-06 MED ORDER — FLUTICASONE-SALMETEROL 500-50 MCG/ACT IN AEPB: 1.0000 | INHALATION_SPRAY | Freq: Two times a day (BID) | RESPIRATORY_TRACT | 5 refills | Status: DC

## 2023-11-06 MED ORDER — LEVALBUTEROL TARTRATE 45 MCG/ACT IN AERO
4.0000 | INHALATION_SPRAY | Freq: Once | RESPIRATORY_TRACT | Status: AC
  Administered 2023-11-06: 4 via RESPIRATORY_TRACT

## 2023-11-06 MED ORDER — AIRSUPRA 90-80 MCG/ACT IN AERO: 2.0000 | INHALATION_SPRAY | RESPIRATORY_TRACT | 3 refills | Status: DC | PRN

## 2023-11-06 MED ORDER — RYALTRIS 665-25 MCG/ACT NA SUSP: 1.0000 | Freq: Two times a day (BID) | NASAL | 5 refills | Status: DC

## 2023-11-06 MED ORDER — EPINEPHRINE 0.3 MG/0.3ML IJ SOAJ: 0.3000 mg | INTRAMUSCULAR | 0 refills | Status: AC | PRN

## 2023-11-06 MED ORDER — MONTELUKAST SODIUM 10 MG PO TABS: 10.0000 mg | ORAL_TABLET | Freq: Every day | ORAL | 5 refills | Status: DC

## 2023-11-06 MED ORDER — METHYLPREDNISOLONE ACETATE 40 MG/ML IJ SUSP
40.0000 mg | Freq: Once | INTRAMUSCULAR | Status: AC
  Administered 2023-11-06: 40 mg via INTRAMUSCULAR

## 2023-11-06 MED ORDER — LEVOCETIRIZINE DIHYDROCHLORIDE 5 MG PO TABS: 5.0000 mg | ORAL_TABLET | Freq: Every evening | ORAL | 5 refills | Status: DC

## 2023-11-06 NOTE — Progress Notes (Signed)
 FOLLOW UP Date of Service/Encounter:  11/06/23  Subjective:  Amy [name] (DOB: [birth year]) is a 43 y.o. female who [facility] in re-evaluation of the following:  allergic rhinoconjunctivitis, perioral dermatitis, asthma  History obtained from: chart review and patient.  For Review, LV was on 02/22/23  with Dr.Reia Viernes seen for routine follow-up. See below for summary of history and diagnostics.   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  Throat tightness and trouble getting air in.  1 course of OCS in 2024.  No prior hospitalizations.  Up-to-date with vaccines.  No smoke exposure. AEC (02/03/22): 100   CXR on (05/06/20): read as normal, images not available for review  Continued on Advair 500, 1 puff twice daily and Singulair  daily. -Nonobstructive ratio with low FEV1 with significant bronchodilator response.  0.78 ratio pre-, 0.92 post, FEV1 73% pre-, 91% post Allergic Rhinitis:  Started in adulthood.  Symptoms include headache, cough, congestion, rhinorrhea, dry eyes.  Worse in spring and fall.  No prior ENT surgeries triggered by irritants. - SPT environmental panel (11/22/22): positive to cat, indoor/outdoor molds, negative IDs.  Rash AIT initiated 02/05/2023. Reached full maintenance 05/14/23 Perioral dermatitis Small bumps around mouth on occasion --------------------------------------------------- Today presents for follow-up.  History of Present Illness   Amy [name] is a 43 year old female with asthma who presents with difficulty breathing and chest tightness.  She has been experiencing difficulty breathing and chest tightness since last week, particularly on Wednesday, which prompted her to  seek care at urgent care. She received a breathing treatment and was prescribed steroids, which she took as a burst of 40 mg for five days, ending on Saturday.  She describes persistent chest tightness, both anteriorly and posteriorly, without wheezing. She uses her albuterol  inhaler when engaging in activities but notes it did not provide relief during the acute episode. She continues to use Wixela, the generic form of Advair, at a dose of 500 mcg, one puff twice daily.  She reports a dry cough that began last week, coinciding with the onset of her breathing difficulties. The cough worsens with movement and when lying down. She also notes increased drainage since last week but denies facial pressure or fever.  She identifies [employer] as a potential trigger for her symptoms, noting that this is the worst episode she has experienced. She confirms using her allergy  medication regularly as prescribed including Ryaltris , singulair  and OTC AH.      Chart Review: UC visit 10/30/23-seen for intermittent asthma with exacerbation-given prednisone  burst. Last AIT 10/01/23-received 0.5 mL red vials, coming monthly.  All medications reviewed by clinical staff and updated in chart. No new pertinent medical or surgical history except as noted in HPI.  ROS: All others negative except as noted per HPI.   Objective:  BP 124/86 (BP Location: Right Arm, Patient Position: Sitting, Cuff Size: Normal)   Pulse 98   Temp 98.3 F (36.8 C) (Oral)   Resp 20   Wt 175 lb (79.4 kg)   SpO2 98%   BMI 28.25 kg/m  Body mass index is 28.25 kg/m. Physical Exam: General Appearance:  Alert, cooperative, no distress, appears stated age  Head:  Normocephalic, without obvious abnormality, atraumatic  Eyes:  Conjunctiva clear, EOM's intact  Ears Left TM with fluid level, right TM dull but no  fluid levels, no bulging or erythema and EACs normal bilaterally  Nose: Nares normal, hypertrophic turbinates, normal mucosa,  no visible anterior polyps, and septum midline  Throat: Lips, tongue normal; teeth and gums normal, normal posterior oropharynx  Neck: Supple, symmetrical  Lungs:   clear to auscultation bilaterally, Respirations unlabored, no coughing  Heart:  regular rate and rhythm and no murmur, Appears well perfused  Extremities: No edema  Skin: Skin color, texture, turgor normal and no rashes or lesions on visualized portions of skin  Neurologic: No gross deficits   Labs:  Lab Orders  No laboratory test(s) ordered today    Spirometry:  Tracings reviewed. Her effort: It was hard to get consistent efforts and there is a question as to whether this reflects a maximal maneuver. FVC: 2.78L pre, 2.86 L, 75% post FEV1: 2.68L, 87% predicted pre, 2.78 L , 90% post FEV1/FVC ratio: 0.96 pre, 0.97 post Interpretation: Spirometry consistent with possible restrictive disease.  Please see scanned spirometry results for details.  Assessment/Plan    Perennial Allergic Rhinitis. Recent exacerbation but improved on AIT. - allergy  testing positive to cat, indoor/outdoor molds - Prevention:  - allergen avoidance when possible - continue allergy  injections per protocol, continue to access to epinephrine  autoinjector during allergy  injection visit. - Symptom control: - Continue Ryaltris  1-2 sprays in each nostril twice a day as needed for nasal congestion/itchy nose - Continue Singulair  (Montelukast ) 10mg  nightly.   - Discontinue if nightmares of behavior changes. - Continue Antihistamine: daily or daily as needed.   -Options include Zyrtec (Cetirizine) 10mg , Claritin (Loratadine) 10mg , Allegra (Fexofenadine) 180mg , or Xyzal  (Levocetirinze) 5mg  - Can be purchased over-the-counter if not covered by insurance.  Allergic Conjunctivitis: at goal - Consider Allergy  Eye drops-great options include Pataday  (Olopatadine ) or Zaditor (ketotifen) for eye symptoms daily as needed-both sold over the counter if not covered by  insurance. and Rewetting Drops such as Systane,TheraTears, etc  -Avoid eye drops that say red eye relief as they may contain medications that dry out your eyes. . Mild persistent Asthma: with exacerbation. 40 mg IM depo Spirometry today showed restriction without obstruction and low FEV1, no improvement following bronchodilator, but clinically feeling improved and on exam air flow improved. Hold allergy  injection for this week. If better by next week, okay to come then. Use albuterol  2-4 puffs every 4-6 hours for the next 2-3 days. - Controller Inhaler: Continue Wixela 500 mcg 1 puff twice a day; This Should Be Used Everyday - Rinse mouth out after use - Rescue Inhaler: Airsupra  2 puffs. Use  every 4-6 hours as needed for chest tightness, wheezing, or coughing.  Can also use 15 minutes prior to exercise if you have symptoms with activity. - Asthma is not controlled if:  - Symptoms are occurring >2 times a week OR  - >2 times a month nighttime awakenings  - You are requiring systemic steroids (prednisone /steroid injections) more than once per year  - Your require hospitalization for your asthma.  - Please call the clinic to schedule a follow up if these symptoms arise  Perioral dermatitis: resolved - Non-steroid treatment options:  Eucrisa  2% or protopic  1% (will send to see if covered): apply topically twice daily as needed (can use in place of steroid creams if desires)   Follow up : 6 months, sooner for allergy  injections It was a pleasure seeing you again in clinic today! Thank you for allowing me to participate in your care.  Other: none  Jonathon Neighbors, MD  [facility] 

## 2023-11-06 NOTE — Patient Instructions (Addendum)
 Perennial Allergic Rhinitis. - allergy  testing positive to cat, indoor/outdoor molds - Prevention:  - allergen avoidance when possible - continue allergy  injections per protocol - Symptom control: - Continue Ryaltris  1-2 sprays in each nostril twice a day as needed for nasal congestion/itchy nose - Continue Singulair  (Montelukast ) 10mg  nightly.   - Discontinue if nightmares of behavior changes. - Continue Antihistamine: daily or daily as needed.   -Options include Zyrtec (Cetirizine) 10mg , Claritin (Loratadine) 10mg , Allegra (Fexofenadine) 180mg , or Xyzal  (Levocetirinze) 5mg  - Can be purchased over-the-counter if not covered by insurance.  Allergic Conjunctivitis:  - Consider Allergy  Eye drops-great options include Pataday  (Olopatadine ) or Zaditor (ketotifen) for eye symptoms daily as needed-both sold over the counter if not covered by insurance. and Rewetting Drops such as Systane,TheraTears, etc  -Avoid eye drops that say red eye relief as they may contain medications that dry out your eyes. . Mild persistent Asthma: 40 mg IM depo Spirometry today showed restriction without obstruction and low FEV1, no improvement following bronchodilator, but clinically feeling improved and on exam air flow improved. Hold allergy  injection for this week. If better by next week, okay to come then. Use albuterol  2-4 puffs every 4-6 hours for the next 2-3 days. - Controller Inhaler: Continue Wixela 500 mcg 1 puff twice a day; This Should Be Used Everyday - Rinse mouth out after use - Rescue Inhaler:  Airsupra  2 puffs . Use  every 4-6 hours as needed for chest tightness, wheezing, or coughing.  Can also use 15 minutes prior to exercise if you have symptoms with activity. - Asthma is not controlled if:  - Symptoms are occurring >2 times a week OR  - >2 times a month nighttime awakenings  - You are requiring systemic steroids (prednisone /steroid injections) more than once per year  - Your require  hospitalization for your asthma.  - Please call the clinic to schedule a follow up if these symptoms arise  Perioral dermatitis:  - Non-steroid treatment options:  Eucrisa  2% or protopic  1% (will send to see if covered): apply topically twice daily as needed (can use in place of steroid creams if desires)   Follow up : 6 months, sooner for allergy  injections It was a pleasure seeing you again in clinic today! Thank you for allowing me to participate in your care.  Jonathon Neighbors, MD [facility]  Control of Dog or Cat Allergen  Avoidance is the best way to manage a dog or cat allergy . If you have a dog or cat and are allergic to dog or cats, consider removing the dog or cat from the home. If you have a dog or cat but don't want to find it a new home, or if your family wants a pet even though someone in the household is allergic, here are some strategies that may help keep symptoms at bay:  Keep the pet out of your bedroom and restrict it to only a few rooms. Be advised that keeping the dog or cat in only one room will not limit the allergens to that room. Don't pet, hug or kiss the dog or cat; if you do, wash your hands with soap and water. High-efficiency particulate air (HEPA) cleaners run continuously in a bedroom or living room can reduce allergen levels over time. Regular use of a high-efficiency vacuum cleaner or a central vacuum can reduce allergen levels. Giving your dog or cat a bath at least once a week can reduce airborne allergen. Control of Mold  Allergen   Mold and fungi can grow on a variety of surfaces provided certain temperature and moisture conditions exist.  Outdoor molds grow on plants, decaying vegetation and soil.  The major outdoor mold, Alternaria and Cladosporium, are found in very high numbers during hot and dry conditions.  Generally, a late Summer - Fall peak is seen for common outdoor fungal spores.  Rain will temporarily lower outdoor mold spore  count, but counts rise rapidly when the rainy period ends.  The most important indoor molds are Aspergillus and Penicillium.  Dark, humid and poorly ventilated basements are ideal sites for mold growth.  The next most common sites of mold growth are the bathroom and the kitchen.  Outdoor (Seasonal) Mold Control  Use air conditioning and keep windows closed Avoid exposure to decaying vegetation. Avoid leaf raking. Avoid grain handling. Consider wearing a face mask if working in moldy areas.    Indoor (Perennial) Mold Control   Maintain humidity below 50%. Clean washable surfaces with 5% bleach solution. Remove sources e.g. contaminated carpets.

## 2023-11-07 NOTE — Addendum Note (Signed)
 Addended by: WILLIAMS, Kapri Nero H on: 11/07/2023 04:49 PM   Modules accepted: Orders

## 2023-11-12 ENCOUNTER — Ambulatory Visit (INDEPENDENT_AMBULATORY_CARE_PROVIDER_SITE_OTHER)

## 2023-11-12 DIAGNOSIS — J309 Allergic rhinitis, unspecified: Secondary | ICD-10-CM | POA: Diagnosis not present

## 2023-11-19 ENCOUNTER — Ambulatory Visit (INDEPENDENT_AMBULATORY_CARE_PROVIDER_SITE_OTHER)

## 2023-11-19 DIAGNOSIS — J309 Allergic rhinitis, unspecified: Secondary | ICD-10-CM | POA: Diagnosis not present

## 2023-11-26 ENCOUNTER — Ambulatory Visit (INDEPENDENT_AMBULATORY_CARE_PROVIDER_SITE_OTHER)

## 2023-11-26 DIAGNOSIS — J309 Allergic rhinitis, unspecified: Secondary | ICD-10-CM

## 2023-12-03 ENCOUNTER — Ambulatory Visit (INDEPENDENT_AMBULATORY_CARE_PROVIDER_SITE_OTHER)

## 2023-12-03 DIAGNOSIS — J309 Allergic rhinitis, unspecified: Secondary | ICD-10-CM | POA: Diagnosis not present

## 2023-12-04 ENCOUNTER — Other Ambulatory Visit: Payer: Self-pay | Admitting: *Deleted

## 2023-12-04 MED ORDER — FLUTICASONE-SALMETEROL 500-50 MCG/ACT IN AEPB: 1.0000 | INHALATION_SPRAY | Freq: Two times a day (BID) | RESPIRATORY_TRACT | 4 refills | Status: DC

## 2023-12-06 ENCOUNTER — Other Ambulatory Visit: Payer: Self-pay

## 2024-01-06 ENCOUNTER — Ambulatory Visit (INDEPENDENT_AMBULATORY_CARE_PROVIDER_SITE_OTHER)

## 2024-01-06 DIAGNOSIS — J309 Allergic rhinitis, unspecified: Secondary | ICD-10-CM

## 2024-01-30 ENCOUNTER — Ambulatory Visit (INDEPENDENT_AMBULATORY_CARE_PROVIDER_SITE_OTHER)

## 2024-01-30 DIAGNOSIS — J309 Allergic rhinitis, unspecified: Secondary | ICD-10-CM | POA: Diagnosis not present

## 2024-03-05 ENCOUNTER — Ambulatory Visit (INDEPENDENT_AMBULATORY_CARE_PROVIDER_SITE_OTHER)

## 2024-03-05 DIAGNOSIS — J309 Allergic rhinitis, unspecified: Secondary | ICD-10-CM

## 2024-03-11 ENCOUNTER — Ambulatory Visit: Admitting: Orthopaedic Surgery

## 2024-03-17 ENCOUNTER — Encounter: Payer: Self-pay | Admitting: Internal Medicine

## 2024-03-18 ENCOUNTER — Other Ambulatory Visit: Payer: Self-pay | Admitting: *Deleted

## 2024-03-18 MED ORDER — MUPIROCIN 2 % EX OINT: 1.0000 | TOPICAL_OINTMENT | Freq: Two times a day (BID) | CUTANEOUS | 1 refills | Status: AC | PRN

## 2024-03-20 ENCOUNTER — Other Ambulatory Visit: Payer: Self-pay | Admitting: *Deleted

## 2024-03-20 NOTE — Telephone Encounter (Signed)
 PA submitted for Wixela 500 to [facility].(Key: [id number])

## 2024-03-23 NOTE — Telephone Encounter (Signed)
 Approved on September 12 by [facility] 2017 NCPDP Request Reference Number: [id number]. WIXELA INHUB AER 500/50 is approved through 03/20/2025. Your patient may now fill this prescription and it will be covered. Effective Date: 03/20/2024 Authorization Expiration Date: 03/20/2025

## 2024-03-31 ENCOUNTER — Ambulatory Visit (INDEPENDENT_AMBULATORY_CARE_PROVIDER_SITE_OTHER)

## 2024-03-31 DIAGNOSIS — J309 Allergic rhinitis, unspecified: Secondary | ICD-10-CM | POA: Diagnosis not present

## 2024-05-01 DIAGNOSIS — J302 Other seasonal allergic rhinitis: Secondary | ICD-10-CM | POA: Diagnosis not present

## 2024-05-01 DIAGNOSIS — J3081 Allergic rhinitis due to animal (cat) (dog) hair and dander: Secondary | ICD-10-CM | POA: Diagnosis not present

## 2024-05-01 NOTE — Progress Notes (Signed)
 VIAL MADE ON 05/01/24

## 2024-05-07 ENCOUNTER — Encounter: Payer: Self-pay | Admitting: Internal Medicine

## 2024-05-07 ENCOUNTER — Ambulatory Visit: Admitting: Internal Medicine

## 2024-05-07 ENCOUNTER — Other Ambulatory Visit: Payer: Self-pay

## 2024-05-07 VITALS — BP 118/70 | HR 95 | Temp 97.9°F | Resp 18 | Wt 179.6 lb

## 2024-05-07 DIAGNOSIS — J3089 Other allergic rhinitis: Secondary | ICD-10-CM | POA: Diagnosis not present

## 2024-05-07 DIAGNOSIS — J4530 Mild persistent asthma, uncomplicated: Secondary | ICD-10-CM

## 2024-05-07 DIAGNOSIS — H1013 Acute atopic conjunctivitis, bilateral: Secondary | ICD-10-CM

## 2024-05-07 DIAGNOSIS — L710 Perioral dermatitis: Secondary | ICD-10-CM | POA: Diagnosis not present

## 2024-05-07 DIAGNOSIS — Z888 Allergy status to other drugs, medicaments and biological substances status: Secondary | ICD-10-CM

## 2024-05-07 MED ORDER — AIRSUPRA 90-80 MCG/ACT IN AERO: 2.0000 | INHALATION_SPRAY | RESPIRATORY_TRACT | 3 refills | Status: AC | PRN

## 2024-05-07 MED ORDER — HYDROCORTISONE 2.5 % EX OINT: TOPICAL_OINTMENT | CUTANEOUS | 3 refills | Status: AC

## 2024-05-07 MED ORDER — RYALTRIS 665-25 MCG/ACT NA SUSP: 1.0000 | Freq: Two times a day (BID) | NASAL | 5 refills | Status: AC

## 2024-05-07 MED ORDER — VTAMA 1 % EX CREA: 1.0000 | TOPICAL_CREAM | Freq: Every day | CUTANEOUS | 3 refills | Status: AC | PRN

## 2024-05-07 MED ORDER — FLUTICASONE-SALMETEROL 500-50 MCG/ACT IN AEPB: 1.0000 | INHALATION_SPRAY | Freq: Two times a day (BID) | RESPIRATORY_TRACT | 4 refills | Status: AC

## 2024-05-07 MED ORDER — MONTELUKAST SODIUM 10 MG PO TABS: 10.0000 mg | ORAL_TABLET | Freq: Every day | ORAL | 5 refills | Status: DC

## 2024-05-07 MED ORDER — OLOPATADINE HCL 0.2 % OP SOLN: 2.0000 [drp] | Freq: Two times a day (BID) | OPHTHALMIC | 5 refills | Status: AC | PRN

## 2024-05-07 NOTE — Progress Notes (Signed)
 FOLLOW UP Date of Service/Encounter:   05/07/2024  Subjective:  Amy [name] (DOB: April 08, 1981) is a 43 y.o. female who [facility] in re-evaluation of the following: allergic rhinoconjunctivitis, perioral dermatitis, asthma  History obtained from: chart review and patient.  For Review, LV was on 05/07/24  with Dr.Liseth Wann seen for routine follow-up. See below for summary of history and diagnostics.   Therapeutic plans/changes recommended: doing well on AIT, FEV1 87%, asthma exacerbating requiring 40 mg of IM Depo.  We held her allergy  injections at that visit. 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  Throat tightness and trouble getting air in.  1 course of OCS in 2024.  No prior hospitalizations.  Up-to-date with vaccines.  No smoke exposure. AEC (02/03/22): 100   CXR on (05/06/20): read as normal, images not available for review  Continued on Advair 500, 1 puff twice daily and Singulair  daily. -Nonobstructive ratio with low FEV1 with significant bronchodilator response.  0.78 ratio pre-, 0.92 post, FEV1 73% pre-, 91% post Allergic Rhinitis:  Started in adulthood.  Symptoms include headache, cough, congestion, rhinorrhea, dry eyes.  Worse in spring and fall.  No prior ENT surgeries triggered by irritants. - SPT environmental panel (11/22/22): positive to cat, indoor/outdoor molds, negative IDs.  RUSh AIT initiated 02/05/2023. Reached full maintenance 05/14/23 Perioral dermatitis Small bumps around mouth on occasion --------------------------------------------------- Today presents for follow-up. Discussed the use of AI scribe software for clinical note transcription with the patient, who gave verbal consent to proceed.  History of Present Illness Amy [name] is a 43  year old female with allergies and asthma who presents for follow-up on allergy  shots and medication management.  Allergic rhinitis and allergen immunotherapy - Allergy  shots are improving symptoms. - Currently taking Allegra daily after switching from Zyrtec. - Uses nasal spray and Singulair  daily for allergy  management. - Uses eye drops as needed, especially for irritation from leaves.  Asthma symptoms and control - No asthma flares since last visit. - Uses Airsupra  once or twice per month. - No prednisone  required since last visit. - Activity level has been low recently.  Perioral dermatitis - Occasional breakouts around the mouth, particularly in the corners. - Recently obtained mupirocin  that has been prescribed previously - Uses hydrocortisone  ointment for affected areas. - Uncertain if non-steroidal treatments were filled due to insurance coverage issues. Not currently listed as    All medications reviewed by clinical staff and updated in chart. No new pertinent medical or surgical history except as noted in HPI.  ROS: All others negative except as noted per HPI.   Objective:  BP 118/70   Pulse 95   Temp 97.9 F (36.6 C) (Temporal)   Resp 18   Wt 179 lb 9.6 oz (81.5 kg)   SpO2 97%   BMI 28.99 kg/m  Body mass index is 28.99 kg/m. Physical Exam: General Appearance:  Alert, cooperative, no distress, appears stated age  Head:  Normocephalic, without obvious abnormality, atraumatic  Eyes:  Conjunctiva clear, EOM's intact  Ears EACs normal bilaterally and normal TMs bilaterally  Nose: Nares normal, hypertrophic turbinates, normal mucosa, and no visible anterior polyps  Throat: Lips, tongue normal; teeth and gums normal, normal posterior oropharynx  Neck: Supple, symmetrical  Lungs:   clear to auscultation bilaterally, Respirations unlabored, no coughing  Heart:  regular rate and rhythm and no murmur, Appears well  perfused  Extremities: No edema  Skin: Skin color,  texture, turgor normal and no rashes or lesions on visualized portions of skin  Neurologic: No gross deficits   Labs:  Lab Orders  No laboratory test(s) ordered today    Spirometry:  Tracings reviewed. Her effort: Good reproducible efforts. FVC: 3.04L FEV1: 2.81L, 94% predicted FEV1/FVC ratio: 0.92 Interpretation: Spirometry consistent with normal pattern.  Please see scanned spirometry results for details.  Assessment/Plan    Perennial Allergic Rhinitis. On AIT, doing well. - allergy  testing positive to cat, indoor/outdoor molds - Prevention:  - allergen avoidance when possible - continue allergy  injections per protocol - Symptom control: - Continue Ryaltris  1-2 sprays in each nostril twice a day as needed for nasal congestion/itchy nose - Continue Singulair  (Montelukast ) 10mg  nightly.   - Discontinue if nightmares of behavior changes. - Continue Antihistamine: daily or daily as needed.   -Options include Zyrtec (Cetirizine) 10mg , Claritin (Loratadine) 10mg , Allegra (Fexofenadine) 180mg , or Xyzal  (Levocetirinze) 5mg  - Can be purchased over-the-counter if not covered by insurance.  Allergic Conjunctivitis: on AIT - Consider Allergy  Eye drops-great options include Pataday  (Olopatadine ) or Zaditor (ketotifen) for eye symptoms daily as needed-both sold over the counter if not covered by insurance. and Rewetting Drops such as Systane,TheraTears, etc  -Avoid eye drops that say red eye relief as they may contain medications that dry out your eyes. . Mild persistent Asthma: controlled - Controller Inhaler: Continue Wixela 500 mcg 1 puff twice a day; This Should Be Used Everyday - Rinse mouth out after use - Rescue Inhaler: Airsupra  2 puffs. Use  every 4-6 hours as needed for chest tightness, wheezing, or coughing.  Can also use 15 minutes prior to exercise if you have symptoms with activity. - Asthma is not controlled if:  - Symptoms are occurring >2 times a week OR  - >2 times a  month nighttime awakenings  - You are requiring systemic steroids (prednisone /steroid injections) more than once per year  - Your require hospitalization for your asthma.  - Please call the clinic to schedule a follow up if these symptoms arise  Perioral dermatitis: controlled - Non-steroid treatment options: VTAMA once daily as needed for breakout around mouth   Follow up : 12 months, sooner for allergy  injections It was a pleasure seeing you again in clinic today! Thank you for allowing me to participate in your care.  Rocky Endow, MD [facility]  Other: VTAMA samples provided, allergy  injection given in clinic today  Rocky Endow, MD  [facility] 

## 2024-05-07 NOTE — Patient Instructions (Addendum)
 Perennial Allergic Rhinitis. On AIT, doing well. - allergy  testing positive to cat, indoor/outdoor molds - Prevention:  - allergen avoidance when possible - continue allergy  injections per protocol - Symptom control: - Continue Ryaltris  1-2 sprays in each nostril twice a day as needed for nasal congestion/itchy nose - Continue Singulair  (Montelukast ) 10mg  nightly.   - Discontinue if nightmares of behavior changes. - Continue Antihistamine: daily or daily as needed.   -Options include Zyrtec (Cetirizine) 10mg , Claritin (Loratadine) 10mg , Allegra (Fexofenadine) 180mg , or Xyzal  (Levocetirinze) 5mg  - Can be purchased over-the-counter if not covered by insurance.  Allergic Conjunctivitis: on AIT - Consider Allergy  Eye drops-great options include Pataday  (Olopatadine ) or Zaditor (ketotifen) for eye symptoms daily as needed-both sold over the counter if not covered by insurance. and Rewetting Drops such as Systane,TheraTears, etc  -Avoid eye drops that say red eye relief as they may contain medications that dry out your eyes. . Mild persistent Asthma: controlled - Controller Inhaler: Continue Wixela 500 mcg 1 puff twice a day; This Should Be Used Everyday - Rinse mouth out after use - Rescue Inhaler: Airsupra  2 puffs. Use  every 4-6 hours as needed for chest tightness, wheezing, or coughing.  Can also use 15 minutes prior to exercise if you have symptoms with activity. - Asthma is not controlled if:  - Symptoms are occurring >2 times a week OR  - >2 times a month nighttime awakenings  - You are requiring systemic steroids (prednisone /steroid injections) more than once per year  - Your require hospitalization for your asthma.  - Please call the clinic to schedule a follow up if these symptoms arise  Perioral dermatitis:  - Non-steroid treatment options: VTAMA once daily as needed for breakout around mouth   Follow up : 12 months, sooner for allergy  injections It was a pleasure seeing you  again in clinic today! Thank you for allowing me to participate in your care.  Rocky Endow, MD [facility]  Control of Dog or Cat Allergen  Avoidance is the best way to manage a dog or cat allergy . If you have a dog or cat and are allergic to dog or cats, consider removing the dog or cat from the home. If you have a dog or cat but don't want to find it a new home, or if your family wants a pet even though someone in the household is allergic, here are some strategies that may help keep symptoms at bay:  Keep the pet out of your bedroom and restrict it to only a few rooms. Be advised that keeping the dog or cat in only one room will not limit the allergens to that room. Don't pet, hug or kiss the dog or cat; if you do, wash your hands with soap and water. High-efficiency particulate air (HEPA) cleaners run continuously in a bedroom or living room can reduce allergen levels over time. Regular use of a high-efficiency vacuum cleaner or a central vacuum can reduce allergen levels. Giving your dog or cat a bath at least once a week can reduce airborne allergen. Control of Mold Allergen   Mold and fungi can grow on a variety of surfaces provided certain temperature and moisture conditions exist.  Outdoor molds grow on plants, decaying vegetation and soil.  The major outdoor mold, Alternaria and Cladosporium, are found in very high numbers during hot and dry conditions.  Generally, a late Summer - Fall peak is seen for common outdoor fungal spores.  Rain will temporarily  lower outdoor mold spore count, but counts rise rapidly when the rainy period ends.  The most important indoor molds are Aspergillus and Penicillium.  Dark, humid and poorly ventilated basements are ideal sites for mold growth.  The next most common sites of mold growth are the bathroom and the kitchen.  Outdoor (Seasonal) Mold Control  Use air conditioning and keep windows closed Avoid exposure to decaying  vegetation. Avoid leaf raking. Avoid grain handling. Consider wearing a face mask if working in moldy areas.    Indoor (Perennial) Mold Control   Maintain humidity below 50%. Clean washable surfaces with 5% bleach solution. Remove sources e.g. contaminated carpets.

## 2024-05-11 ENCOUNTER — Other Ambulatory Visit: Payer: Self-pay | Admitting: *Deleted

## 2024-05-11 ENCOUNTER — Encounter: Payer: Self-pay | Admitting: Radiology

## 2024-05-11 MED ORDER — MONTELUKAST SODIUM 10 MG PO TABS: 10.0000 mg | ORAL_TABLET | Freq: Every day | ORAL | 5 refills | Status: AC

## 2024-06-16 ENCOUNTER — Ambulatory Visit

## 2024-06-16 DIAGNOSIS — J3089 Other allergic rhinitis: Secondary | ICD-10-CM | POA: Diagnosis not present

## 2024-07-13 ENCOUNTER — Ambulatory Visit (INDEPENDENT_AMBULATORY_CARE_PROVIDER_SITE_OTHER)

## 2024-07-13 DIAGNOSIS — J302 Other seasonal allergic rhinitis: Secondary | ICD-10-CM | POA: Diagnosis not present

## 2024-07-20 ENCOUNTER — Ambulatory Visit

## 2024-07-20 DIAGNOSIS — J302 Other seasonal allergic rhinitis: Secondary | ICD-10-CM

## 2024-07-30 ENCOUNTER — Encounter: Payer: Self-pay | Admitting: Internal Medicine

## 2024-08-04 ENCOUNTER — Ambulatory Visit

## 2024-08-04 DIAGNOSIS — J302 Other seasonal allergic rhinitis: Secondary | ICD-10-CM
